# Patient Record
Sex: Male | Born: 1965 | Race: Black or African American | Hispanic: No | Marital: Single | Smoking: Never smoker
Health system: Southern US, Community
[De-identification: ages and names within clinical notes are randomized; demographics above are authoritative.]

## PROBLEM LIST (undated history)

## (undated) DIAGNOSIS — Z720 Tobacco use: Secondary | ICD-10-CM

## (undated) DIAGNOSIS — K760 Fatty (change of) liver, not elsewhere classified: Secondary | ICD-10-CM

## (undated) DIAGNOSIS — F1210 Cannabis abuse, uncomplicated: Secondary | ICD-10-CM

## (undated) DIAGNOSIS — R739 Hyperglycemia, unspecified: Secondary | ICD-10-CM

## (undated) DIAGNOSIS — D696 Thrombocytopenia, unspecified: Secondary | ICD-10-CM

## (undated) DIAGNOSIS — E663 Overweight: Secondary | ICD-10-CM

## (undated) DIAGNOSIS — M1990 Unspecified osteoarthritis, unspecified site: Secondary | ICD-10-CM

## (undated) HISTORY — PX: APPENDECTOMY: SHX54

---

## 1998-09-02 ENCOUNTER — Encounter: Payer: Self-pay | Admitting: Emergency Medicine

## 1998-09-02 ENCOUNTER — Emergency Department (HOSPITAL_COMMUNITY): Admission: EM | Admit: 1998-09-02 | Discharge: 1998-09-02 | Payer: Self-pay | Admitting: Emergency Medicine

## 2003-01-02 ENCOUNTER — Encounter: Payer: Self-pay | Admitting: Anesthesiology

## 2003-01-02 ENCOUNTER — Encounter (INDEPENDENT_AMBULATORY_CARE_PROVIDER_SITE_OTHER): Payer: Self-pay

## 2003-01-02 ENCOUNTER — Encounter: Payer: Self-pay | Admitting: Surgery

## 2003-01-03 ENCOUNTER — Inpatient Hospital Stay (HOSPITAL_COMMUNITY): Admission: EM | Admit: 2003-01-03 | Discharge: 2003-01-07 | Payer: Self-pay | Admitting: Emergency Medicine

## 2006-01-14 ENCOUNTER — Emergency Department (HOSPITAL_COMMUNITY): Admission: EM | Admit: 2006-01-14 | Discharge: 2006-01-14 | Payer: Self-pay | Admitting: Emergency Medicine

## 2006-02-20 ENCOUNTER — Emergency Department (HOSPITAL_COMMUNITY): Admission: EM | Admit: 2006-02-20 | Discharge: 2006-02-21 | Payer: Self-pay | Admitting: Emergency Medicine

## 2006-12-22 ENCOUNTER — Emergency Department (HOSPITAL_COMMUNITY): Admission: EM | Admit: 2006-12-22 | Discharge: 2006-12-22 | Payer: Self-pay | Admitting: Emergency Medicine

## 2007-04-15 ENCOUNTER — Ambulatory Visit (HOSPITAL_COMMUNITY): Admission: RE | Admit: 2007-04-15 | Discharge: 2007-04-15 | Payer: Self-pay | Admitting: Gastroenterology

## 2007-04-15 ENCOUNTER — Encounter (INDEPENDENT_AMBULATORY_CARE_PROVIDER_SITE_OTHER): Payer: Self-pay | Admitting: Gastroenterology

## 2010-09-23 ENCOUNTER — Emergency Department (HOSPITAL_COMMUNITY)
Admission: EM | Admit: 2010-09-23 | Discharge: 2010-09-24 | Disposition: A | Discharge status: Home / Self Care | Payer: Self-pay | Attending: Emergency Medicine | Admitting: Emergency Medicine

## 2010-09-23 DIAGNOSIS — R51 Headache: Secondary | ICD-10-CM | POA: Insufficient documentation

## 2010-09-23 DIAGNOSIS — I951 Orthostatic hypotension: Secondary | ICD-10-CM | POA: Insufficient documentation

## 2010-09-23 DIAGNOSIS — K921 Melena: Secondary | ICD-10-CM | POA: Insufficient documentation

## 2010-09-24 ENCOUNTER — Emergency Department (HOSPITAL_COMMUNITY): Payer: Self-pay

## 2010-09-24 LAB — CBC
HCT: 45 % (ref 39.0–52.0)
Hemoglobin: 15.5 g/dL (ref 13.0–17.0)
MCH: 29.8 pg (ref 26.0–34.0)
MCHC: 34.4 g/dL (ref 30.0–36.0)
MCV: 86.4 fL (ref 78.0–100.0)
Platelets: 143 10*3/uL — ABNORMAL LOW (ref 150–400)
RBC: 5.21 MIL/uL (ref 4.22–5.81)
RDW: 12.8 % (ref 11.5–15.5)
WBC: 7.2 10*3/uL (ref 4.0–10.5)

## 2010-09-24 LAB — DIFFERENTIAL
Basophils Absolute: 0 10*3/uL (ref 0.0–0.1)
Basophils Relative: 0 % (ref 0–1)
Eosinophils Absolute: 0.2 10*3/uL (ref 0.0–0.7)
Eosinophils Relative: 3 % (ref 0–5)
Lymphocytes Relative: 29 % (ref 12–46)
Lymphs Abs: 2.1 10*3/uL (ref 0.7–4.0)
Monocytes Absolute: 0.8 10*3/uL (ref 0.1–1.0)
Monocytes Relative: 12 % (ref 3–12)
Neutro Abs: 4.1 10*3/uL (ref 1.7–7.7)
Neutrophils Relative %: 56 % (ref 43–77)

## 2010-09-24 LAB — BASIC METABOLIC PANEL
Calcium: 8.8 mg/dL (ref 8.4–10.5)
Chloride: 107 mEq/L (ref 96–112)
Creatinine, Ser: 1.2 mg/dL (ref 0.4–1.5)
GFR calc Af Amer: >60 mL/min (ref >60)
GFR calc non Af Amer: >60 mL/min (ref >60)

## 2010-09-24 LAB — HEPATIC FUNCTION PANEL
Alkaline Phosphatase: 40 U/L (ref 39–117)
Bilirubin, Direct: 0.2 mg/dL (ref 0.0–0.3)
Indirect Bilirubin: 0.8 mg/dL (ref 0.3–0.9)
Total Bilirubin: 1 mg/dL (ref 0.3–1.2)
Total Protein: 7.6 g/dL (ref 6.0–8.3)

## 2010-09-24 LAB — OCCULT BLOOD, POC DEVICE: Fecal Occult Bld: NEGATIVE

## 2010-09-24 LAB — TROPONIN I: Troponin I: 0.01 ng/mL (ref 0.00–0.06)

## 2010-09-24 LAB — CK TOTAL AND CKMB (NOT AT ARMC): Total CK: 362 U/L — ABNORMAL HIGH (ref 7–232)

## 2010-11-08 NOTE — Op Note (Signed)
NAMEHAO, Ian Baker                         ACCOUNT NO.:  [id number]   MEDICAL RECORD NO.:  [id number]                   PATIENT TYPE:  EMS   LOCATION:  ED                                   FACILITY:  [facility]   PHYSICIAN:  Abigail Miyamoto, M.D.              DATE OF BIRTH:  Aug 19, 1965   DATE OF PROCEDURE:  01/02/2003  DATE OF DISCHARGE:                                 OPERATIVE REPORT   PREOPERATIVE DIAGNOSIS:  Perforated appendicitis.   POSTOPERATIVE DIAGNOSIS:  Perforated appendicitis.   PROCEDURE:  Open appendectomy.   SURGEON:  Abigail Miyamoto, M.D.   ANESTHESIA:  General endotracheal anesthesia.   ESTIMATED BLOOD LOSS:  Minimal.   INDICATIONS FOR PROCEDURE:  [name] is a 45 year old gentleman who  presented with right lower quadrant abdominal pain and tenderness. He also  had a fever of 102. A CT scan of the abdomen and pelvis was performed which  showed the patient to have appendicitis with an appendicolith and a possible  periappendiceal abscess.   FINDINGS:  The patient was indeed found to have perforated appendicitis with  abscess.   DESCRIPTION OF PROCEDURE:  The patient was brought to the operating room,  identified as Meredeth Ide. He was placed supine on the operating table  and general anesthesia was induced. His abdomen was then prepped and draped  in the usual sterile fashion. Using a #10 blade, a small transverse incision  was made in the patient's right lower quadrant. The incision was  carried  down through Scarpa's fascia with the electrocautery. The external oblique  fascia was then identified and opened with electrocautery and then further  with the Metzenbaum scissors. The underlying muscle fibers have been bluntly  dissected and retracted. The peritoneum was identified and opened with the  scalpel. Upon entering the abdomen, the patient was found to have turbid  fluid and a small abscess at the appendiceal base. The appendix was found  to  be stuck into the retroperitoneum but after finger fracturing was easily  elevated up out of the incision. the appendix was found to be ruptured.  Several adhesions around the appendix were taken down with the  electrocautery. The mesoappendix was then identified and clamped with a  hemostat and taken down with a 2-0 Vicryl tie. The base of the appendix was  then identified and clasped with a hemostat and then transected with a  scalpel. The appendiceal stump was then closed with two separate 2-0 Vicryl  suture ligatures. Hemostasis appeared to be achieved. The right lower  quadrant was then irrigated with copious amounts of normal saline. Again  hemostasis appeared to be achieved. The peritoneum was then closed with a  running 2-0 Vicryl suture. The fascia was then closed with a running #1  Prolene. The skin was then irrigated further. The Scarpa's fascia was  then closed interrupted 3-0 Vicryl sutures, the skin was closed with skin  staples.  The patient tolerated the procedure well. All sponge, needle and  instrument counts were correct at the end of the procedure. The patient was  then extubated in the operating room and taken in stable condition to the  recovery room.                                               Abigail Miyamoto, M.D.    DB/MEDQ  D:  01/02/2003  T:  01/02/2003  Job:  [id number]

## 2010-11-08 NOTE — Discharge Summary (Signed)
   NAMEFAARIS, ARIZPE                         ACCOUNT NO.:  [id number]   MEDICAL RECORD NO.:  [id number]                   PATIENT TYPE:  INP   LOCATION:  0462                                 FACILITY:  [facility]   PHYSICIAN:  Abigail Miyamoto, M.D.              DATE OF BIRTH:  1966-01-08   DATE OF ADMISSION:  01/02/2003  DATE OF DISCHARGE:  01/07/2003                                 DISCHARGE SUMMARY   SUMMARY OF HISTORY:  The patient is a 45 year old gentleman who presented to  the hospital with abdominal pain.  He was found to have a tender abdomen on  examination.  A CAT scan showed findings consistent with acute appendicitis.   HOSPITAL COURSE:  The patient was admitted on January 02, 2003 and taken to the  operating room where he underwent an open appendectomy for a perforated  appendix.  He was taken in stable condition to a regular surgical floor for  postoperative IV antibiotics.  He was maintained on IV antibiotics for  several days after surgery.  On postoperative day two his temperature was  101.  He did have hypokalemia and potassium was replaced orally.  IV  antibiotics were continued.  He continued to slowly spike temperatures, but  then eventually defervesced.  On postoperative day four he did have some  mild erythema at his incision.  This was unchanged from the previous days.  His abdomen remained nondistended.  He began having bowel movements and was  tolerating p.o. well.  On postoperative day five his white count was normal  at 7.1.  He was afebrile for greater than 24 hours.  His incision was  healing well and decision was made to discharge patient home.   DISCHARGE DIAGNOSES:  Perforated appendicitis status post open appendectomy.   DISCHARGE MEDICATIONS:  He will take Percocet and Advil for pain.  He will  take Augmentin 875 mg b.i.d. for seven days.   ACTIVITY:  He is to do no heavy lifting greater than 20 pounds for four  weeks.   WOUND CARE:  He may  shower.   DIET:  Unrestricted.   FOLLOWUP:  He will follow up with my office on this Wednesday after  discharge for wound check.                                               Abigail Miyamoto, M.D.    DB/MEDQ  D:  01/19/2003  T:  01/19/2003  Job:  [id number]

## 2010-11-08 NOTE — H&P (Signed)
NAMEHAGEN, Ian Baker                         ACCOUNT NO.:  [id number]   MEDICAL RECORD NO.:  [id number]                   PATIENT TYPE:  EMS   LOCATION:  ED                                   FACILITY:  [facility]   PHYSICIAN:  Abigail Miyamoto, M.D.              DATE OF BIRTH:  01-14-1966   DATE OF ADMISSION:  01/02/2003  DATE OF DISCHARGE:                                HISTORY & PHYSICAL   CHIEF COMPLAINT:  Abdominal pain.   HISTORY:  This is a 45 year old black gentleman who presents with a two to  three day history of right lower quadrant abdominal pain.  He reports he has  had nausea and vomiting with this, but he has been moving his bowels.  He  has otherwise been healthy.  Reports the pain is quite severe and doubling  him over.  He has also had fever with this, but no chills.  He denies any  jaundice.  Otherwise, from a cardiopulmonary standpoint he is without  complaints.   PAST MEDICAL HISTORY:  Negative.   PAST SURGICAL HISTORY:  Negative.   ALLERGIES:  No known drug allergies.   MEDICATIONS:  None.   SOCIAL HISTORY:  He does not smoke, does not drink alcohol.  He lives alone.   REVIEW OF SYSTEMS:  Negative from a cardiopulmonary standpoint.  Negative  from a urinary standpoint, and otherwise unremarkable or as above.   PHYSICAL EXAMINATION:  GENERAL:  A large black gentleman in moderate  discomfort laying on his side.  VITAL SIGNS:  Temperature is 102.5, pulse is 69, blood pressure is 112/75,  respiratory rate is 20.  GENERAL:  He is ill-appearing.  HEENT:  He is anicteric.  His pupils react bilaterally.  Ears, nose, mouth,  and throat shows external ears and nose are normal, hearing is normal,  oropharynx is clear.  NECK:  Supple.  There is no adenopathy.  There is no thyromegaly.  LUNGS:  Clear to auscultation bilaterally with normal effort.  CARDIOVASCULAR:  Regular rate and rhythm with no murmurs.  There is no  peripheral edema.  ABDOMEN:  Soft.  There is  marked tenderness with guarding in the right lower  quadrant.  The rest of the abdomen is soft and nontender throughout.  There  are no masses, there is no hernia, there is no organomegaly.  EXTREMITIES:  Warm and well perfused.   LABORATORY DATA:  The patient comes with labs from Urgent Care showing him  to have a normal urinalysis.  His white blood count is 10.3 with a  hemoglobin of 16.2, hematocrit of 45.5, and platelets of 141,000.  CAT scan  of the abdomen and pelvis shows the patient to have inflammation of the  right lower quadrant with appendicolith and possible abscess and inflamed-  appearing appendix.   ASSESSMENT AND PLAN:  This is a 45 year old gentleman with acute  appendicitis with possible appendiceal abscess with  perforation.  At this  point, the plan is to proceed to the operating room for appendectomy.  I  discussed  the risks of surgery with the patient, including the risk of bleeding,  infection, pelvic abscess, need to leave the skin open, bowel leak, etc.  At  this point, he wishes to proceed.  Surgery will be performed emergently.  Preoperative antibiotics and IV fluid bolus will be given.                                               Abigail Miyamoto, M.D.    DB/MEDQ  D:  01/02/2003  T:  01/02/2003  Job:  [id number]

## 2011-04-08 LAB — POCT CARDIAC MARKERS
CKMB, poc: 1.5
CKMB, poc: 1.5
Myoglobin, poc: 70.7
Operator id: 4761
Troponin i, poc: <0.05

## 2011-07-13 ENCOUNTER — Other Ambulatory Visit: Payer: Self-pay

## 2011-07-13 ENCOUNTER — Encounter (HOSPITAL_COMMUNITY): Payer: Self-pay | Admitting: *Deleted

## 2011-07-13 ENCOUNTER — Emergency Department (HOSPITAL_COMMUNITY): Payer: Self-pay

## 2011-07-13 ENCOUNTER — Inpatient Hospital Stay (HOSPITAL_COMMUNITY)
Admission: EM | Admit: 2011-07-13 | Discharge: 2011-07-15 | DRG: 287 | Disposition: A | Discharge status: Home / Self Care | Payer: 59 | Attending: Cardiology | Admitting: Cardiology

## 2011-07-13 DIAGNOSIS — K7689 Other specified diseases of liver: Secondary | ICD-10-CM | POA: Diagnosis present

## 2011-07-13 DIAGNOSIS — Z720 Tobacco use: Secondary | ICD-10-CM | POA: Insufficient documentation

## 2011-07-13 DIAGNOSIS — I214 Non-ST elevation (NSTEMI) myocardial infarction: Secondary | ICD-10-CM

## 2011-07-13 DIAGNOSIS — R079 Chest pain, unspecified: Secondary | ICD-10-CM | POA: Insufficient documentation

## 2011-07-13 DIAGNOSIS — R0789 Other chest pain: Principal | ICD-10-CM | POA: Diagnosis present

## 2011-07-13 DIAGNOSIS — D696 Thrombocytopenia, unspecified: Secondary | ICD-10-CM | POA: Insufficient documentation

## 2011-07-13 DIAGNOSIS — F17200 Nicotine dependence, unspecified, uncomplicated: Secondary | ICD-10-CM | POA: Diagnosis present

## 2011-07-13 DIAGNOSIS — R739 Hyperglycemia, unspecified: Secondary | ICD-10-CM | POA: Insufficient documentation

## 2011-07-13 DIAGNOSIS — M129 Arthropathy, unspecified: Secondary | ICD-10-CM | POA: Diagnosis present

## 2011-07-13 DIAGNOSIS — F1210 Cannabis abuse, uncomplicated: Secondary | ICD-10-CM | POA: Insufficient documentation

## 2011-07-13 DIAGNOSIS — E785 Hyperlipidemia, unspecified: Secondary | ICD-10-CM | POA: Diagnosis present

## 2011-07-13 DIAGNOSIS — R7309 Other abnormal glucose: Secondary | ICD-10-CM | POA: Diagnosis present

## 2011-07-13 HISTORY — DX: Hyperglycemia, unspecified: R73.9

## 2011-07-13 HISTORY — DX: Cannabis abuse, uncomplicated: F12.10

## 2011-07-13 HISTORY — DX: Overweight: E66.3

## 2011-07-13 HISTORY — DX: Fatty (change of) liver, not elsewhere classified: K76.0

## 2011-07-13 HISTORY — DX: Tobacco use: Z72.0

## 2011-07-13 HISTORY — DX: Unspecified osteoarthritis, unspecified site: M19.90

## 2011-07-13 HISTORY — DX: Thrombocytopenia, unspecified: D69.6

## 2011-07-13 LAB — DIFFERENTIAL
Basophils Relative: 0 % (ref 0–1)
Eosinophils Absolute: 0.1 10*3/uL (ref 0.0–0.7)
Eosinophils Relative: 1 % (ref 0–5)
Neutrophils Relative %: 42 % — ABNORMAL LOW (ref 43–77)

## 2011-07-13 LAB — RAPID URINE DRUG SCREEN, HOSP PERFORMED
Amphetamines: NOT DETECTED
Barbiturates: NOT DETECTED
Benzodiazepines: NOT DETECTED
Cocaine: NOT DETECTED
Tetrahydrocannabinol: POSITIVE — AB

## 2011-07-13 LAB — BASIC METABOLIC PANEL
BUN: 11 mg/dL (ref 6–23)
Calcium: 9.5 mg/dL (ref 8.4–10.5)
GFR calc Af Amer: >90 mL/min (ref >90)
GFR calc non Af Amer: 82 mL/min — ABNORMAL LOW (ref >90)
Potassium: 3.8 mEq/L (ref 3.5–5.1)
Sodium: 142 mEq/L (ref 135–145)

## 2011-07-13 LAB — CARDIAC PANEL(CRET KIN+CKTOT+MB+TROPI)
CK, MB: 2.1 ng/mL (ref 0.3–4.0)
Relative Index: 1 (ref 0.0–2.5)
Troponin I: <0.3 ng/mL (ref <0.30)

## 2011-07-13 LAB — URINALYSIS, ROUTINE W REFLEX MICROSCOPIC
Bilirubin Urine: NEGATIVE
Hgb urine dipstick: NEGATIVE
Ketones, ur: 15 mg/dL — AB
Nitrite: NEGATIVE
pH: 7.5 (ref 5.0–8.0)

## 2011-07-13 LAB — CBC
MCH: 29.6 pg (ref 26.0–34.0)
MCHC: 34.7 g/dL (ref 30.0–36.0)
MCV: 85.4 fL (ref 78.0–100.0)
Platelets: 152 10*3/uL (ref 150–400)

## 2011-07-13 LAB — D-DIMER, QUANTITATIVE: D-Dimer, Quant: 0.56 ug/mL-FEU — ABNORMAL HIGH (ref 0.00–0.48)

## 2011-07-13 LAB — TROPONIN I
Troponin I: <0.3 ng/mL (ref <0.30)
Troponin I: 0.49 ng/mL (ref <0.30)

## 2011-07-13 LAB — URINE MICROSCOPIC-ADD ON

## 2011-07-13 MED ORDER — IOHEXOL 300 MG/ML  SOLN
80.0000 mL | Freq: Once | INTRAMUSCULAR | Status: AC | PRN
  Administered 2011-07-13: 80 mL via INTRAVENOUS

## 2011-07-13 MED ORDER — SODIUM CHLORIDE 0.9 % IJ SOLN: 3.0000 mL | INTRAMUSCULAR | Status: DC | PRN

## 2011-07-13 MED ORDER — ACETAMINOPHEN 325 MG PO TABS
650.0000 mg | ORAL_TABLET | ORAL | Status: DC | PRN
  Administered 2011-07-13: 650 mg via ORAL
  Filled 2011-07-13: qty 2

## 2011-07-13 MED ORDER — HEPARIN BOLUS VIA INFUSION
4000.0000 [IU] | Freq: Once | INTRAVENOUS | Status: AC
  Administered 2011-07-13: 4000 [IU] via INTRAVENOUS
  Filled 2011-07-13: qty 4000

## 2011-07-13 MED ORDER — ROSUVASTATIN CALCIUM 40 MG PO TABS
40.0000 mg | ORAL_TABLET | Freq: Every day | ORAL | Status: DC
  Administered 2011-07-13 – 2011-07-14 (×2): 40 mg via ORAL
  Filled 2011-07-13 (×4): qty 1

## 2011-07-13 MED ORDER — ACETAMINOPHEN 325 MG PO TABS
650.0000 mg | ORAL_TABLET | Freq: Once | ORAL | Status: AC
  Administered 2011-07-13: 650 mg via ORAL
  Filled 2011-07-13: qty 2

## 2011-07-13 MED ORDER — SODIUM CHLORIDE 0.9 % IV SOLN: 250.0000 mL | INTRAVENOUS | Status: DC | PRN

## 2011-07-13 MED ORDER — ALPRAZOLAM 0.25 MG PO TABS: 0.2500 mg | ORAL_TABLET | Freq: Two times a day (BID) | ORAL | Status: DC | PRN

## 2011-07-13 MED ORDER — METOPROLOL TARTRATE 25 MG PO TABS
25.0000 mg | ORAL_TABLET | Freq: Two times a day (BID) | ORAL | Status: DC
  Administered 2011-07-13 – 2011-07-14 (×3): 25 mg via ORAL
  Filled 2011-07-13 (×6): qty 1

## 2011-07-13 MED ORDER — NITROGLYCERIN 0.4 MG SL SUBL
0.4000 mg | SUBLINGUAL_TABLET | SUBLINGUAL | Status: DC | PRN
  Administered 2011-07-13: 0.4 mg via SUBLINGUAL
  Filled 2011-07-13: qty 25

## 2011-07-13 MED ORDER — ASPIRIN EC 81 MG PO TBEC
81.0000 mg | DELAYED_RELEASE_TABLET | Freq: Every day | ORAL | Status: DC
  Filled 2011-07-13 (×3): qty 1

## 2011-07-13 MED ORDER — ASPIRIN 81 MG PO CHEW
324.0000 mg | CHEWABLE_TABLET | Freq: Once | ORAL | Status: AC
  Administered 2011-07-13: 324 mg via ORAL
  Filled 2011-07-13: qty 4

## 2011-07-13 MED ORDER — ZOLPIDEM TARTRATE 5 MG PO TABS: 10.0000 mg | ORAL_TABLET | Freq: Every evening | ORAL | Status: DC | PRN

## 2011-07-13 MED ORDER — SODIUM CHLORIDE 0.9 % IJ SOLN: 3.0000 mL | Freq: Two times a day (BID) | INTRAMUSCULAR | Status: DC

## 2011-07-13 MED ORDER — SODIUM CHLORIDE 0.9 % IJ SOLN
3.0000 mL | Freq: Two times a day (BID) | INTRAMUSCULAR | Status: DC
  Administered 2011-07-13 – 2011-07-14 (×2): 3 mL via INTRAVENOUS

## 2011-07-13 MED ORDER — SODIUM CHLORIDE 0.9 % IV SOLN
1.0000 mL/kg/h | INTRAVENOUS | Status: DC
  Administered 2011-07-14: 1.058 mL/kg/h via INTRAVENOUS
  Administered 2011-07-14: 1 mL/kg/h via INTRAVENOUS

## 2011-07-13 MED ORDER — HEPARIN BOLUS VIA INFUSION
3000.0000 [IU] | Freq: Once | INTRAVENOUS | Status: AC
  Administered 2011-07-13: 3000 [IU] via INTRAVENOUS
  Filled 2011-07-13: qty 3000

## 2011-07-13 MED ORDER — ONDANSETRON HCL 4 MG/2ML IJ SOLN: 4.0000 mg | Freq: Four times a day (QID) | INTRAMUSCULAR | Status: DC | PRN

## 2011-07-13 MED ORDER — HEPARIN SOD (PORCINE) IN D5W 100 UNIT/ML IV SOLN
1500.0000 [IU]/h | INTRAVENOUS | Status: DC
  Administered 2011-07-13: 1200 [IU]/h via INTRAVENOUS
  Administered 2011-07-13 – 2011-07-14 (×2): 1500 [IU]/h via INTRAVENOUS
  Filled 2011-07-13 (×4): qty 250

## 2011-07-13 NOTE — ED Notes (Signed)
Pt complians of chest pain on set at 0730 while getting out of car. Pt sts that worse with deep breath and feels sob, pt denies nausea, denies worse with movement or palpation, nad noted, resp e/u

## 2011-07-13 NOTE — H&P (Signed)
Patient ID: Ian Baker MRN: [id number], DOB/AGE: May 21, 1966   Admit date: 07/13/2011   Primary Physician: None Primary Cardiologist: New to [place]  Pt. Profile:   46 y/o male w/o prior cardiac history that presented to ED this am 2/2 chest pain and tachypalps.  Problem List: Past Medical History  Diagnosis Date  . Arthritis     bilat knees  . Overweight   . Tobacco abuse     1 thin cigar every few months  . Marijuana abuse     smokes about 1x/month    Past Surgical History  Procedure Date  . Appendectomy     20005     Allergies: No Known Allergies  HPI:   46 y/o male w/o prior cardiac history.  This am, after awakening, he ate a brownie that was laced with Marijuana.  About an hour later, he noted tachypalps with assoc chest burning and tightness and dyspnea.  No nausea/diaphoresis.  He went back inside his house and sat and when Ss did not improve, he called a cab to take him to the ED.  Upon arrival to the ED, pt cont to c/o c/p and palps - his HR was in the 90's - sinus rhythm.  He was treated with asa.  CT of his chest was done and was negative.  Though his initial troponin was wnl, f/u troponin is elevated @ 0.49.  We've been asked to eval.  Duration of Ss was about 4 hrs.  He is currently pain free but says he feels "out of sorts," which he attributes to the marijuana.   Home Medications Medications Prior to Admission  Medication Dose Route Frequency Provider Last Rate Last Dose  . acetaminophen (TYLENOL) tablet 650 mg  650 mg Oral Once Glynn Octave, MD   650 mg at 07/13/11 1332  . aspirin chewable tablet 324 mg  324 mg Oral Once Glynn Octave, MD   324 mg at 07/13/11 0953  . heparin ADULT infusion 100 units/ml (25000 units/250 ml)  1,200 Units/hr Intravenous Continuous Marylouise Stacks, PHARMD      . heparin bolus via infusion 4,000 Units  4,000 Units Intravenous Once Marylouise Stacks, PHARMD      . iohexol (OMNIPAQUE) 300 MG/ML solution 80 mL  80  mL Intravenous Once PRN Medication Radiologist, MD   80 mL at 07/13/11 1118   Home Meds  Prn advil/goody's powder  Family History  Problem Relation Age of Onset  . Diabetes Mother   . Coronary artery disease Mother     onset 64's, currently 48    History   Social History  . Marital Status: Single    Spouse Name: N/A    Number of Children: N/A  . Years of Education: N/A   Occupational History  . Cab Driver    Social History Main Topics  . Smoking status: Current Everyday Smoker    Types: Cigars  . Smokeless tobacco: Not on file   Comment: smokes 1 cigar every 3-4 months.  . Alcohol Use: Yes     has a few drinks/month  . Drug Use: Yes     uses marijuana about 1x/month  . Sexually Active: Yes   Other Topics Concern  . Not on file   Social History Narrative   Lives by himself in [place].  Works as a Scientist, physiological.  Walks regularly.  Frequently does push ups.     Review of Systems: General: negative for chills, fever, night sweats or weight changes.  Cardiovascular: +++ for chest pain, dyspnea, and palpitations.  No edema, orthopnea, paroxysmal nocturnal dyspnea. Dermatological: negative for rash Respiratory: negative for cough or wheezing Urologic: negative for hematuria Abdominal: negative for nausea, vomiting, diarrhea, bright red blood per rectum, melena, or hematemesis Neurologic: negative for visual changes, syncope, or dizziness All other systems reviewed and are otherwise negative except as noted above.  Physical Exam: Blood pressure 151/87, pulse 98, temperature 98.4 F (36.9 C), temperature source Oral, resp. rate 18, height 6' (1.829 m), weight 250 lb (113.399 kg).  General: Well developed, well nourished, in no acute distress. Head: Normocephalic, atraumatic, sclera non-icteric, no xanthomas, nares are without discharge.  Neck: Supple without bruits or JVD. Lungs:  Resp regular and unlabored, CTA. Heart: RRR, split s2.  no s3, s4.  1/6 syst murmur @ upper  sternal border. Abdomen: Soft, non-tender, non-distended, BS + x 4.  Msk:  Strength and tone appears normal for age. Extremities: No clubbing, cyanosis or edema. DP/PT/Radials 2+ and equal bilaterally. Neuro: Alert and oriented X 3. Moves all extremities spontaneously. Psych: Normal affect.   Labs:   Results for orders placed during the hospital encounter of 07/13/11 (from the past 72 hour(s))  CBC     Status: Normal   Collection Time   07/13/11  9:40 AM      Component Value Range Comment   WBC 6.4  4.0 - 10.5 (K/uL)    RBC 5.27  4.22 - 5.81 (MIL/uL)    Hemoglobin 15.6  13.0 - 17.0 (g/dL)    HCT 16.1  09.6 - 04.5 (%)    MCV 85.4  78.0 - 100.0 (fL)    MCH 29.6  26.0 - 34.0 (pg)    MCHC 34.7  30.0 - 36.0 (g/dL)    RDW 40.9  81.1 - 91.4 (%)    Platelets 152  150 - 400 (K/uL)   DIFFERENTIAL     Status: Abnormal   Collection Time   07/13/11  9:40 AM      Component Value Range Comment   Neutrophils Relative 42 (*) 43 - 77 (%)    Neutro Abs 2.7  1.7 - 7.7 (K/uL)    Lymphocytes Relative 49 (*) 12 - 46 (%)    Lymphs Abs 3.1  0.7 - 4.0 (K/uL)    Monocytes Relative 8  3 - 12 (%)    Monocytes Absolute 0.5  0.1 - 1.0 (K/uL)    Eosinophils Relative 1  0 - 5 (%)    Eosinophils Absolute 0.1  0.0 - 0.7 (K/uL)    Basophils Relative 0  0 - 1 (%)    Basophils Absolute 0.0  0.0 - 0.1 (K/uL)   BASIC METABOLIC PANEL     Status: Abnormal   Collection Time   07/13/11  9:40 AM      Component Value Range Comment   Sodium 142  135 - 145 (mEq/L)    Potassium 3.8  3.5 - 5.1 (mEq/L)    Chloride 104  96 - 112 (mEq/L)    CO2 28  19 - 32 (mEq/L)    Glucose, Bld 122 (*) 70 - 99 (mg/dL)    BUN 11  6 - 23 (mg/dL)    Creatinine, Ser 7.82  0.50 - 1.35 (mg/dL)    Calcium 9.5  8.4 - 10.5 (mg/dL)    GFR calc non Af Amer 82 (*) >90 (mL/min)    GFR calc Af Amer >90  >90 (mL/min)   PROTIME-INR     Status:  Normal   Collection Time   07/13/11  9:40 AM      Component Value Range Comment   Prothrombin Time 12.5   11.6 - 15.2 (seconds)    INR 0.91  0.00 - 1.49    TROPONIN I     Status: Normal   Collection Time   07/13/11  9:40 AM      Component Value Range Comment   Troponin I <0.30  <0.30 (ng/mL)   D-DIMER, QUANTITATIVE     Status: Abnormal   Collection Time   07/13/11  9:40 AM      Component Value Range Comment   D-Dimer, Quant 0.56 (*) 0.00 - 0.48 (ug/mL-FEU)   ETHANOL     Status: Normal   Collection Time   07/13/11 10:18 AM      Component Value Range Comment   Alcohol, Ethyl (B) <11  0 - 11 (mg/dL)   URINALYSIS, ROUTINE W REFLEX MICROSCOPIC     Status: Abnormal   Collection Time   07/13/11 10:43 AM      Component Value Range Comment   Color, Urine YELLOW  YELLOW     APPearance CLEAR  CLEAR     Specific Gravity, Urine 1.029  1.005 - 1.030     pH 7.5  5.0 - 8.0     Glucose, UA NEGATIVE  NEGATIVE (mg/dL)    Hgb urine dipstick NEGATIVE  NEGATIVE     Bilirubin Urine NEGATIVE  NEGATIVE     Ketones, ur 15 (*) NEGATIVE (mg/dL)    Protein, ur 30 (*) NEGATIVE (mg/dL)    Urobilinogen, UA 0.2  0.0 - 1.0 (mg/dL)    Nitrite NEGATIVE  NEGATIVE     Leukocytes, UA NEGATIVE  NEGATIVE    URINE RAPID DRUG SCREEN (HOSP PERFORMED)     Status: Abnormal   Collection Time   07/13/11 10:43 AM      Component Value Range Comment   Opiates NONE DETECTED  NONE DETECTED     Cocaine NONE DETECTED  NONE DETECTED     Benzodiazepines NONE DETECTED  NONE DETECTED     Amphetamines NONE DETECTED  NONE DETECTED     Tetrahydrocannabinol POSITIVE (*) NONE DETECTED     Barbiturates NONE DETECTED  NONE DETECTED    URINE MICROSCOPIC-ADD ON     Status: Normal   Collection Time   07/13/11 10:43 AM      Component Value Range Comment   Squamous Epithelial / LPF RARE  RARE    TROPONIN I     Status: Abnormal   Collection Time   07/13/11 12:25 PM      Component Value Range Comment   Troponin I 0.49 (*) <0.30 (ng/mL)      Radiology/Studies: Dg Chest 2 View  07/13/2011  *RADIOLOGY REPORT*  Clinical Data: Chest pain  CHEST - 2  VIEW  Comparison:  None.  Findings:  The heart size and mediastinal contours are within normal limits.  Both lungs are clear.  The visualized skeletal structures are unremarkable.  IMPRESSION: No active cardiopulmonary disease.  Original Report Authenticated By: Harrel Lemon, M.D.   Ct Angio Chest W/cm &/or Wo Cm  07/13/2011  *RADIOLOGY REPORT*  Clinical Data: Chest pain, altered mental status  CT ANGIOGRAPHY CHEST  Technique:  Multidetector CT imaging of the chest using the standard protocol during bolus administration of intravenous contrast. Multiplanar reconstructed images including MIPs were obtained and reviewed to evaluate the vascular anatomy.  Contrast: 80mL OMNIPAQUE IOHEXOL 300 MG/ML IV  SOLN  Comparison: Chest radiographs dated 07/13/2011  Findings: No evidence of pulmonary embolism.  Mild dependent atelectasis in the bilateral lower lobes.  The lungs are otherwise clear.  No pleural effusion or pneumothorax.  The heart is normal in size.  No pericardial effusion.  No suspicious mediastinal, hilar, or axillary lymphadenopathy.  Visualized upper abdomen is notable for hepatic steatosis.  Visualized osseous structures are within normal limits.  IMPRESSION: No evidence of pulmonary embolism.  No evidence of acute cardiopulmonary disease.  Original Report Authenticated By: Charline Bills, M.D.    EKG:  RSR, 90, no acute st/t changes.  ASSESSMENT AND PLAN:   1.  NSTEMI:  Pt presented with c/p this am and has now been found to have an elevated troponin.  He is currently pain free.  Admit and cycle CE.  Temporal relationship between marijuana usage and Ss.  UDS negative for cocaine.  Add asa, bb, statin, heparin.  Will plan cath in am or sooner for recurrent/recalcitrant pain.  2.  HTN:  Add bb in setting of above.  Follow.  3.  Lipids:  Status unknown.  Check lipids/lft's.  Add high dose statin.  4.  MJ/Tob Abuse:  Cessation advised.  Will need formal counseling.   Signed, Nicolasa Ducking, NP 07/13/2011, 3:21 PM  History reviewed with the patient, no changes to be made.  Chest pain consistent with unstable angina after eating marijuana laced brownie. Troponin elevated.  EKG without acute changes.  Other drug screen negative. The patient exam reveals no distress, lungs clear, abdomin obese, no edema, normal pulses.  All available labs, radiology testing, previous records reviewed. Agree with documented assessment and plan. Given the description of the pain and the elevated troponin catheterization is indicated.   Fayrene Fearing Chibueze Beasley  3:55 PM 05/09/2011

## 2011-07-13 NOTE — ED Notes (Signed)
Pt reports approx 1.5hrs ago he developed left sided non radiating chest pain, grabs entire left chest wall, states he was driving when chest pain started. Reports sob. Denies nausea or diaphoresis. Denies medical hx.

## 2011-07-13 NOTE — ED Notes (Signed)
Gave pt a urine cup and a urineal and let him know I needed urine. 10:38 am Ian Baker

## 2011-07-13 NOTE — ED Notes (Signed)
Patient complains of not feeling well. Pt related to eating "weed brownies" at 630 this morning and sts he may have "over done it"

## 2011-07-13 NOTE — ED Notes (Signed)
Critical troponin 0.49 reported to Dr. Manus Gunning

## 2011-07-13 NOTE — ED Provider Notes (Signed)
History     CSN: [id number]  Arrival date & time 07/13/11  4098   First MD Initiated Contact with Patient 07/13/11 [id number]      Chief Complaint  Patient presents with  . Chest Pain    (Consider location/radiation/quality/duration/timing/severity/associated sxs/prior treatment) HPI Comments: Patient presenting with substernal and left-sided chest pain onset about an hour ago. The pain has been constant for the past hour and is not associated with any shortness of breath, nausea diaphoresis. He mentions about 2 hours ago he eats some pot laced brownies.  He denies any cardiac history he denies any cocaine abuse. He's never had panic this in the past. He denies any other drug use. He has no abdominal pain, nausea or vomiting. He's never had a stress test. He is not a smoker.  The history is provided by the patient.    Past Medical History  Diagnosis Date  . Arthritis     bilat knees  . Overweight   . Tobacco abuse     1 thin cigar every few months  . Marijuana abuse     smokes about 1x/month    Past Surgical History  Procedure Date  . Appendectomy     20005    Family History  Problem Relation Age of Onset  . Diabetes Mother   . Coronary artery disease Mother     onset 21's, currently 90    History  Substance Use Topics  . Smoking status: Current Everyday Smoker    Types: Cigars  . Smokeless tobacco: Not on file   Comment: smokes 1 cigar every 3-4 months.  . Alcohol Use: Yes     has a few drinks/month      Review of Systems  Constitutional: Negative for fever, activity change and appetite change.  HENT: Negative for congestion and rhinorrhea.   Respiratory: Negative for cough and shortness of breath.   Cardiovascular: Positive for chest pain.  Gastrointestinal: Negative for nausea, vomiting and abdominal pain.  Genitourinary: Negative for dysuria and hematuria.  Musculoskeletal: Negative for back pain.  Skin: Negative for rash.  Neurological: Negative for  headaches.    Allergies  Review of patient's allergies indicates no known allergies.  Home Medications   No current outpatient prescriptions on file.  BP 107/63  Pulse 88  Temp(Src) 98.1 F (36.7 C) (Oral)  Resp 15  Ht 6' (1.829 m)  Wt 250 lb (113.399 kg)  BMI 33.91 kg/m2  SpO2 98%  Physical Exam  Constitutional: He is oriented to person, place, and time. He appears well-developed and well-nourished. No distress.  HENT:  Head: Normocephalic and atraumatic.  Mouth/Throat: Oropharynx is clear and moist. No oropharyngeal exudate.  Eyes: Conjunctivae are normal. Pupils are equal, round, and reactive to light.  Neck: Normal range of motion.  Cardiovascular: Normal rate, regular rhythm and normal heart sounds.   Pulmonary/Chest: Effort normal and breath sounds normal. No respiratory distress. He exhibits no tenderness.       Chest pain is not reproducible. There is no rash the chest wall.  Abdominal: Soft. There is no tenderness. There is no rebound and no guarding.  Musculoskeletal: Normal range of motion. He exhibits no edema and no tenderness.  Neurological: He is alert and oriented to person, place, and time. No cranial nerve deficit.  Skin: Skin is warm.    ED Course  Procedures (including critical care time)  Labs Reviewed  DIFFERENTIAL - Abnormal; Notable for the following:    Neutrophils Relative 42 (*)  Lymphocytes Relative 49 (*)    All other components within normal limits  BASIC METABOLIC PANEL - Abnormal; Notable for the following:    Glucose, Bld 122 (*)    GFR calc non Af Amer 82 (*)    All other components within normal limits  URINALYSIS, ROUTINE W REFLEX MICROSCOPIC - Abnormal; Notable for the following:    Ketones, ur 15 (*)    Protein, ur 30 (*)    All other components within normal limits  URINE RAPID DRUG SCREEN (HOSP PERFORMED) - Abnormal; Notable for the following:    Tetrahydrocannabinol POSITIVE (*)    All other components within normal  limits  D-DIMER, QUANTITATIVE - Abnormal; Notable for the following:    D-Dimer, Quant 0.56 (*)    All other components within normal limits  TROPONIN I - Abnormal; Notable for the following:    Troponin I 0.49 (*)    All other components within normal limits  CBC  PROTIME-INR  TROPONIN I  ETHANOL  URINE MICROSCOPIC-ADD ON  HEPARIN LEVEL (UNFRACTIONATED)  MRSA PCR SCREENING   Dg Chest 2 View  07/13/2011  *RADIOLOGY REPORT*  Clinical Data: Chest pain  CHEST - 2 VIEW  Comparison:  None.  Findings:  The heart size and mediastinal contours are within normal limits.  Both lungs are clear.  The visualized skeletal structures are unremarkable.  IMPRESSION: No active cardiopulmonary disease.  Original Report Authenticated By: Harrel Lemon, M.D.   Ct Angio Chest W/cm &/or Wo Cm  07/13/2011  *RADIOLOGY REPORT*  Clinical Data: Chest pain, altered mental status  CT ANGIOGRAPHY CHEST  Technique:  Multidetector CT imaging of the chest using the standard protocol during bolus administration of intravenous contrast. Multiplanar reconstructed images including MIPs were obtained and reviewed to evaluate the vascular anatomy.  Contrast: 80mL OMNIPAQUE IOHEXOL 300 MG/ML IV SOLN  Comparison: Chest radiographs dated 07/13/2011  Findings: No evidence of pulmonary embolism.  Mild dependent atelectasis in the bilateral lower lobes.  The lungs are otherwise clear.  No pleural effusion or pneumothorax.  The heart is normal in size.  No pericardial effusion.  No suspicious mediastinal, hilar, or axillary lymphadenopathy.  Visualized upper abdomen is notable for hepatic steatosis.  Visualized osseous structures are within normal limits.  IMPRESSION: No evidence of pulmonary embolism.  No evidence of acute cardiopulmonary disease.  Original Report Authenticated By: Charline Bills, M.D.     1. NSTEMI (non-ST elevated myocardial infarction)       MDM  Chest pain after being marijuana laced brownies.  Hemodynamically stable, EKG nonischemic. Sending labs with troponin, d-dimer and drug screen  First set of cardiac enzymes negative. D-dimer mildly elevated. Patient's pain is improving. He says he feels "loopy" which he attribute to marijuana use.    CT negative for pulmonary embolism. No appreciable rash and patient's chest wall. Second cardiac enzyme elevated at 0.49. Suspect mild demand ischemia from marijuana ingestion. Discussed with Dr. Dietrich Pates who will consult on patient. Treat as NSTEMI and initiate heparin and nitroglycerin.   Date: 07/13/2011  Rate: 90  Rhythm: normal sinus rhythm  QRS Axis: normal  Intervals: normal  ST/T Wave abnormalities: normal  Conduction Disutrbances:none  Narrative Interpretation:   Old EKG Reviewed: unchanged  CRITICAL CARE Performed by: Glynn Octave   Total critical care time: 30  Critical care time was exclusive of separately billable procedures and treating other patients.  Critical care was necessary to treat or prevent imminent or life-threatening deterioration.  Critical care was time spent  personally by me on the following activities: development of treatment plan with patient and/or surrogate as well as nursing, discussions with consultants, evaluation of patient's response to treatment, examination of patient, obtaining history from patient or surrogate, ordering and performing treatments and interventions, ordering and review of laboratory studies, ordering and review of radiographic studies, pulse oximetry and re-evaluation of patient's condition.         Glynn Octave, MD 07/13/11 [phone]

## 2011-07-13 NOTE — Progress Notes (Signed)
ANTICOAGULATION CONSULT NOTE - Initial Consult  Pharmacy Consult for heparin Indication: chest pain/ACS  Not on File  Patient Measurements: Height: 6' (182.9 cm) Weight: 250 lb (113.399 kg) IBW/kg (Calculated) : 77.6   Vital Signs: Temp: 98.4 F (36.9 C) (01/20 0931) Temp src: Oral (01/20 0931) BP: 151/87 mmHg (01/20 0931) Pulse Rate: 98  (01/20 0931)  Labs:  Basename 07/13/11 1225 07/13/11 0940  HGB -- 15.6  HCT -- 45.0  PLT -- 152  APTT -- --  LABPROT -- 12.5  INR -- 0.91  HEPARINUNFRC -- --  CREATININE -- 1.07  CKTOTAL -- --  CKMB -- --  TROPONINI 0.49* <0.30   Estimated Creatinine Clearance: 112.1 ml/min (by C-G formula based on Cr of 1.07).  Medical History: History reviewed. No pertinent past medical history.  Medications:  Scheduled:    . acetaminophen  650 mg Oral Once  . aspirin  324 mg Oral Once    Assessment: 46 yr old male on heparin for ACS/STEMI  Goal of Therapy:  Heparin level=0.3-0.7   Plan:  Heparin 4000 units IV bolus, then run heparin drip at 1200 units/hr.   Check heparin level 6 hours after drip start. Daily heparin level/CBC  Wendie Simmer, PharmD, BCPS Clinical Pharmacist   07/13/2011,2:08 PM

## 2011-07-13 NOTE — ED Notes (Signed)
Pharmacy has been contacted multiple times reagarding heparin order, no heparin on unit, awaiting pharmacy to send heparin and then will hang it.

## 2011-07-13 NOTE — Progress Notes (Signed)
ANTICOAGULATION CONSULT NOTE - Follow Up Consult  Pharmacy Consult for Heparin Indication: chest pain/ACS  No Known Allergies  Patient Measurements: Height: 6' (182.9 cm) Weight: 260 lb 9.3 oz (118.2 kg) IBW/kg (Calculated) : 77.6  Heparin dosing weight: 103.4 kg  Vital Signs: Temp: 98.9 F (37.2 C) (01/20 1947) Temp src: Oral (01/20 1715) BP: 113/73 mmHg (01/20 2200) Pulse Rate: 66  (01/20 2200)  Labs:  Basename 07/13/11 2100 07/13/11 1727 07/13/11 1225 07/13/11 0940  HGB -- -- -- 15.6  HCT -- -- -- 45.0  PLT -- -- -- 152  APTT -- -- -- --  LABPROT -- -- -- 12.5  INR -- -- -- 0.91  HEPARINUNFRC 0.24* -- -- --  CREATININE -- -- -- 1.07  CKTOTAL -- 221 -- --  CKMB -- 2.1 -- --  TROPONINI -- <0.30 0.49* <0.30   Estimated Creatinine Clearance: 114.4 ml/min (by C-G formula based on Cr of 1.07).   Medications:  Infusions:    . sodium chloride    . heparin 1,200 Units/hr (07/13/11 1527)    Assessment: 46 year old male on heparin for ACS/STEMI. Heparin level is sub-therapeutic on 1200 units/hr (12 ml/hr). Plans for cath in am (may go sooner if recurrent pain). RN reports no issues with heparin.   Goal of Therapy:  Heparin level 0.3-0.7 units/ml   Plan:  1. Heparin bolus of 3000 units, then increase heparin to 1500 units/hr (15 ml/hr). 2. Heparin level in 6 hours (ok to do with am labs).   Fayne Norrie 07/13/2011,10:15 PM

## 2011-07-13 NOTE — ED Notes (Signed)
Medications verified heparin dosage with Apolonio Schneiders roby rn

## 2011-07-14 ENCOUNTER — Other Ambulatory Visit: Payer: Self-pay

## 2011-07-14 ENCOUNTER — Encounter (HOSPITAL_COMMUNITY): Payer: Self-pay | Admitting: Dietician

## 2011-07-14 ENCOUNTER — Encounter (HOSPITAL_COMMUNITY)
Admission: EM | Disposition: A | Discharge status: Home / Self Care | Payer: Self-pay | Source: Home / Self Care | Attending: Cardiology

## 2011-07-14 DIAGNOSIS — I219 Acute myocardial infarction, unspecified: Secondary | ICD-10-CM

## 2011-07-14 DIAGNOSIS — R079 Chest pain, unspecified: Secondary | ICD-10-CM

## 2011-07-14 HISTORY — PX: LEFT HEART CATHETERIZATION WITH CORONARY ANGIOGRAM: SHX5451

## 2011-07-14 LAB — BASIC METABOLIC PANEL
BUN: 8 mg/dL (ref 6–23)
Calcium: 9.1 mg/dL (ref 8.4–10.5)
Creatinine, Ser: 1.11 mg/dL (ref 0.50–1.35)
GFR calc non Af Amer: 78 mL/min — ABNORMAL LOW (ref >90)
Glucose, Bld: 106 mg/dL — ABNORMAL HIGH (ref 70–99)

## 2011-07-14 LAB — CBC
HCT: 44.1 % (ref 39.0–52.0)
Hemoglobin: 15.4 g/dL (ref 13.0–17.0)
MCV: 85.3 fL (ref 78.0–100.0)
WBC: 7 10*3/uL (ref 4.0–10.5)

## 2011-07-14 LAB — HEPATIC FUNCTION PANEL
ALT: 39 U/L (ref 0–53)
AST: 27 U/L (ref 0–37)
Albumin: 3.8 g/dL (ref 3.5–5.2)
Bilirubin, Direct: <0.1 mg/dL (ref 0.0–0.3)
Total Bilirubin: 0.5 mg/dL (ref 0.3–1.2)

## 2011-07-14 LAB — HEPARIN LEVEL (UNFRACTIONATED): Heparin Unfractionated: 0.5 IU/mL (ref 0.30–0.70)

## 2011-07-14 LAB — LIPID PANEL
Cholesterol: 172 mg/dL (ref 0–200)
LDL Cholesterol: 102 mg/dL — ABNORMAL HIGH (ref 0–99)
Triglycerides: 109 mg/dL (ref <150)

## 2011-07-14 LAB — CARDIAC PANEL(CRET KIN+CKTOT+MB+TROPI)
CK, MB: 2.1 ng/mL (ref 0.3–4.0)
Total CK: 187 U/L (ref 7–232)

## 2011-07-14 SURGERY — LEFT HEART CATHETERIZATION WITH CORONARY ANGIOGRAM
Anesthesia: LOCAL

## 2011-07-14 MED ORDER — NITROGLYCERIN 0.2 MG/ML ON CALL CATH LAB
INTRAVENOUS | Status: AC
  Filled 2011-07-14: qty 1

## 2011-07-14 MED ORDER — INFLUENZA VIRUS VACC SPLIT PF IM SUSP
0.5000 mL | INTRAMUSCULAR | Status: AC
  Administered 2011-07-15: 0.5 mL via INTRAMUSCULAR
  Filled 2011-07-14: qty 0.5

## 2011-07-14 MED ORDER — HEART ATTACK BOUNCING BOOK
Freq: Once | Status: AC
  Administered 2011-07-14: 23:00:00
  Filled 2011-07-14: qty 1

## 2011-07-14 MED ORDER — HEPARIN (PORCINE) IN NACL 2-0.9 UNIT/ML-% IJ SOLN
INTRAMUSCULAR | Status: AC
  Filled 2011-07-14: qty 2000

## 2011-07-14 MED ORDER — HEPARIN SODIUM (PORCINE) 1000 UNIT/ML IJ SOLN
INTRAMUSCULAR | Status: AC
  Filled 2011-07-14: qty 1

## 2011-07-14 MED ORDER — VERAPAMIL HCL 2.5 MG/ML IV SOLN
INTRAVENOUS | Status: AC
  Filled 2011-07-14: qty 2

## 2011-07-14 MED ORDER — SODIUM CHLORIDE 0.9 % IV SOLN
INTRAVENOUS | Status: AC
  Administered 2011-07-14: 18:00:00 via INTRAVENOUS

## 2011-07-14 MED ORDER — ASPIRIN 81 MG PO CHEW
CHEWABLE_TABLET | ORAL | Status: AC
  Administered 2011-07-14: 324 mg
  Filled 2011-07-14: qty 4

## 2011-07-14 MED ORDER — FENTANYL CITRATE 0.05 MG/ML IJ SOLN
INTRAMUSCULAR | Status: AC
  Filled 2011-07-14: qty 2

## 2011-07-14 MED ORDER — LIDOCAINE HCL (PF) 1 % IJ SOLN
INTRAMUSCULAR | Status: AC
  Filled 2011-07-14: qty 30

## 2011-07-14 MED ORDER — MIDAZOLAM HCL 2 MG/2ML IJ SOLN
INTRAMUSCULAR | Status: AC
  Filled 2011-07-14: qty 2

## 2011-07-14 NOTE — Progress Notes (Signed)
 Patient Name: Ian Baker Date of Encounter: 07/14/2011  Active Problems:  * No active hospital problems. *     SUBJECTIVE:STEMI   OBJECTIVE  Filed Vitals:   07/14/11 0400 07/14/11 0505 07/14/11 0600 07/14/11 0700  BP: 100/56 118/63 119/67   Pulse: 58 61 61   Temp: 97.9 F (36.6 C)     TempSrc: Oral     Resp: 0     Height:      Weight:    264 lb 8.8 oz (120 kg)  SpO2: 98% 100% 100%     Intake/Output Summary (Last 24 hours) at 07/14/11 0758 Last data filed at 07/14/11 0700  Gross per 24 hour  Intake  782.8 ml  Output    650 ml  Net  132.8 ml   Weight change:   PHYSICAL EXAM  General: Well developed, well nourished, in no acute distress. Head: Normocephalic, atraumatic.  Neck: Supple without bruits, no elevation in JVD. Lungs:  Resp regular and unlabored, CTA bilaterally. Heart: RRR, normal S1/S2 no s3, s4, or murmurs. Abdomen: Soft, non-tender, non-distended, BS + x 4.  Extremities: No clubbing, cyanosis, no edema.  Neuro: Alert and oriented X 3. Moves all extremities spontaneously. Psych: Normal affect.  LABS:  CBC: Basename 07/14/11 0503 07/13/11 0940  WBC 7.0 6.4  NEUTROABS -- 2.7  HGB 15.4 15.6  HCT 44.1 45.0  MCV 85.3 85.4  PLT 143* 152   Basic Metabolic Panel: Basename 07/14/11 0503 07/13/11 0940  NA 141 142  K 3.9 3.8  CL 105 104  CO2 29 28  GLUCOSE 106* 122*  BUN 8 11  CREATININE 1.11 1.07  CALCIUM 9.1 9.5  MG -- --  PHOS -- --   Liver Function Tests: No results found for this basename: AST:2,ALT:2,ALKPHOS:2,BILITOT:2,PROT:2,ALBUMIN:2 in the last 72 hours No results found for this basename: LIPASE:2,AMYLASE:2 in the last 72 hours  Cardiac Enzymes: Basename 07/14/11 0025 07/13/11 1727 07/13/11 1225  CKTOTAL 187 221 --  CKMB 2.1 2.1 --  CKMBINDEX -- -- --  TROPONINI <0.30 <0.30 0.49*   BNP: No components found with this basename: POCBNP:3  D-Dimer: Basename 07/13/11 0940  DDIMER 0.56*   Hemoglobin A1C:  Basename  07/13/11 2100  HGBA1C 5.8*   Fasting Lipid Panel:  Basename 07/14/11 0503  CHOL 172  HDL 48  LDLCALC 102*  TRIG 109  CHOLHDL 3.6  LDLDIRECT --   Thyroid Function Tests: No results found for this basename: TSH,T4TOTAL,FREET3,T3FREE,THYROIDAB in the last 72 hours Anemia Panel: No results found for this basename: VITAMINB12,FOLATE,FERRITIN,TIBC,IRON,RETICCTPCT in the last 72 hours  TELE: SR   Radiology/Studies:  Dg Chest 2 View 07/13/2011  *RADIOLOGY REPORT*  Clinical Data: Chest pain  CHEST - 2 VIEW  Comparison:  None.  Findings:  The heart size and mediastinal contours are within normal limits.  Both lungs are clear.  The visualized skeletal structures are unremarkable.  IMPRESSION: No active cardiopulmonary disease.  Original Report Authenticated By: GRETCHEN E. GREEN, M.D.   Ct Angio Chest W/cm &/or Wo Cm 07/13/2011  *RADIOLOGY REPORT*  Clinical Data: Chest pain, altered mental status  CT ANGIOGRAPHY CHEST  Technique:  Multidetector CT imaging of the chest using the standard protocol during bolus administration of intravenous contrast. Multiplanar reconstructed images including MIPs were obtained and reviewed to evaluate the vascular anatomy.  Contrast: 80mL OMNIPAQUE IOHEXOL 300 MG/ML IV SOLN  Comparison: Chest radiographs dated 07/13/2011  Findings: No evidence of pulmonary embolism.  Mild dependent atelectasis in the bilateral lower lobes.    The lungs are otherwise clear.  No pleural effusion or pneumothorax.  The heart is normal in size.  No pericardial effusion.  No suspicious mediastinal, hilar, or axillary lymphadenopathy.  Visualized upper abdomen is notable for hepatic steatosis.  Visualized osseous structures are within normal limits.  IMPRESSION: No evidence of pulmonary embolism.  No evidence of acute cardiopulmonary disease.  Original Report Authenticated By: SRIYESH KRISHNAN, M.D.    Current Medications:     . acetaminophen  650 mg Oral Once  . aspirin      . aspirin  324  mg Oral Once  . aspirin EC  81 mg Oral Daily  . heparin  3,000 Units Intravenous Once  . heparin  4,000 Units Intravenous Once  . metoprolol tartrate  25 mg Oral BID  . rosuvastatin  40 mg Oral q1800  . sodium chloride  3 mL Intravenous Q12H  . sodium chloride  3 mL Intravenous Q12H    ASSESSMENT AND PLAN: 1. NSTEMI - f/u ez negative but symptoms concerning. Cath today, cont. ASA/hep/BB/statin. 2. Hepatic steatosis - ck LFTs 3. Dyslipidemia - cont. Statin 4. THC use - cessation encouraged. 5.  tobacco use - cessation consult   Signed, Rhonda Barrett , PA-C  History reviewed with the patient.  He did get chest pain again last night and received NTG SL.  He is pain free this am. The patient exam reveals no rub, no murmur.  Lungs clear.  No edema.  All available labs, radiology testing, previous records reviewed. Agree with documented assessment and plan.  Further enzymes are negative.  However, he has had continued pain.  Therefore, cath is indicated.   Isaia Hassell  9:07 AM 05/09/2011  

## 2011-07-14 NOTE — Progress Notes (Signed)
Nutrition Education  Pt admitted with MI, dyslipidemia, hepatic steatosis, THC use not on Crestor.  Drives a taxi for a living.  Considers himself active, walks and swims.  Regular diet prior to admit.  Usual body weight for the past 10 years is 245-260#.  Current weight is 264#.  Height 6'.  BMI=36.  Pt meets criteria for obesity.    Instructed pt on Heart Healthy diet as well as weight loss discussed.   Written info with RD name and number provided.   Oran Rein, RD [phone]

## 2011-07-14 NOTE — Progress Notes (Signed)
TR BAND REMOVAL  LOCATION:    right radial  DEFLATED PER PROTOCOL:    yes  TIME BAND OFF / DRESSING APPLIED:    2105   SITE UPON ARRIVAL:    Level 0  SITE AFTER BAND REMOVAL:    Level 0  REVERSE ALLEN'S TEST:     positive  CIRCULATION SENSATION AND MOVEMENT:    Within Normal Limits   yes  COMMENTS:   Some oozing reported during initial deflation during the day shift.  No issues since.  Site WNL. Pt is stable.

## 2011-07-14 NOTE — Op Note (Signed)
Cardiac Catheterization Procedure Note  Name: Ian Baker MRN: [id number] DOB: 1966-02-23  Procedure:  1.  Placement of catheters for angio without left heart cath 2.  Coronary arteriography  Indication: Chest pain with neg CT and pos troponin.    Procedural Details: The right wrist was prepped, draped, and anesthetized with 1% lidocaine. Using the modified Seldinger technique, a 5 French sheath was introduced into the right radial artery. 3 mg of verapamil was administered through the sheath, weight-based unfractionated heparin was administered intravenously. Standard Judkins catheters were used for selective coronary angiography. Because of a diagnostic echo study, LV angio was not performed.  Catheter exchanges were performed over an exchange length guidewire. There were no immediate procedural complications. A TR band was used for radial hemostasis at the completion of the procedure.  The patient was transferred to the post catheterization recovery area for further monitoring.  Procedural Findings: Hemodynamics: AO 108/71 LV not done  Coronary angiography: Coronary dominance: right  Left mainstem: Large in caliber, not calcified, and free of significant disease.  Left anterior descending (LAD): Courses to the apex and supplies the distal inferior wall as well.  No calcium.  The LAD proper appears from of disease, and is quite smooth.  There is a ramus and proximal diagonal that appear smooth, but overlap proximally in multiple views making clear delineation difficult.  However, there is no suggestion of high grade obstruction.    Left circumflex (LCx): Consists of a small OM1 and 2, and larger OM3.  No significant obstruction.   Right coronary artery (RCA): Provides a PDA and PLA.  No significant obstruction noted.   Left ventriculography: Not done  Final Conclusions:   1.  Normal LV function by echo--see report 2.  Smooth appearing coronaries without significant obstruction  noted.   Recommendations:  1.  Stop smoking.   2.  Risk factor reduction.    Shawnie Pons 07/14/2011, 5:31 PM

## 2011-07-14 NOTE — Progress Notes (Signed)
  Echocardiogram 2D Echocardiogram has been performed.  Ian Baker 07/14/2011, 9:48 AM

## 2011-07-14 NOTE — H&P (View-Only) (Signed)
Patient Name: Ian Baker Date of Encounter: 07/14/2011  Active Problems:  * No active hospital problems. *     SUBJECTIVE:STEMI   OBJECTIVE  Filed Vitals:   07/14/11 0400 07/14/11 0505 07/14/11 0600 07/14/11 0700  BP: 100/56 118/63 119/67   Pulse: 58 61 61   Temp: 97.9 F (36.6 C)     TempSrc: Oral     Resp: 0     Height:      Weight:    264 lb 8.8 oz (120 kg)  SpO2: 98% 100% 100%     Intake/Output Summary (Last 24 hours) at 07/14/11 0758 Last data filed at 07/14/11 0700  Gross per 24 hour  Intake  782.8 ml  Output    650 ml  Net  132.8 ml   Weight change:   PHYSICAL EXAM  General: Well developed, well nourished, in no acute distress. Head: Normocephalic, atraumatic.  Neck: Supple without bruits, no elevation in JVD. Lungs:  Resp regular and unlabored, CTA bilaterally. Heart: RRR, normal S1/S2 no s3, s4, or murmurs. Abdomen: Soft, non-tender, non-distended, BS + x 4.  Extremities: No clubbing, cyanosis, no edema.  Neuro: Alert and oriented X 3. Moves all extremities spontaneously. Psych: Normal affect.  LABS:  CBC: Basename 07/14/11 0503 07/13/11 0940  WBC 7.0 6.4  NEUTROABS -- 2.7  HGB 15.4 15.6  HCT 44.1 45.0  MCV 85.3 85.4  PLT 143* 152   Basic Metabolic Panel: Basename 07/14/11 0503 07/13/11 0940  NA 141 142  K 3.9 3.8  CL 105 104  CO2 29 28  GLUCOSE 106* 122*  BUN 8 11  CREATININE 1.11 1.07  CALCIUM 9.1 9.5  MG -- --  PHOS -- --   Liver Function Tests: No results found for this basename: AST:2,ALT:2,ALKPHOS:2,BILITOT:2,PROT:2,ALBUMIN:2 in the last 72 hours No results found for this basename: LIPASE:2,AMYLASE:2 in the last 72 hours  Cardiac Enzymes: Basename 07/14/11 0025 07/13/11 1727 07/13/11 1225  CKTOTAL 187 221 --  CKMB 2.1 2.1 --  CKMBINDEX -- -- --  TROPONINI <0.30 <0.30 0.49*   BNP: No components found with this basename: POCBNP:3  D-Dimer: Basename 07/13/11 0940  DDIMER 0.56*   Hemoglobin A1C:  Basename  07/13/11 2100  HGBA1C 5.8*   Fasting Lipid Panel:  Basename 07/14/11 0503  CHOL 172  HDL 48  LDLCALC 102*  TRIG 109  CHOLHDL 3.6  LDLDIRECT --   Thyroid Function Tests: No results found for this basename: TSH,T4TOTAL,FREET3,T3FREE,THYROIDAB in the last 72 hours Anemia Panel: No results found for this basename: VITAMINB12,FOLATE,FERRITIN,TIBC,IRON,RETICCTPCT in the last 72 hours  TELE: SR   Radiology/Studies:  Dg Chest 2 View 07/13/2011  *RADIOLOGY REPORT*  Clinical Data: Chest pain  CHEST - 2 VIEW  Comparison:  None.  Findings:  The heart size and mediastinal contours are within normal limits.  Both lungs are clear.  The visualized skeletal structures are unremarkable.  IMPRESSION: No active cardiopulmonary disease.  Original Report Authenticated By: Harrel Lemon, M.D.   Ct Angio Chest W/cm &/or Wo Cm 07/13/2011  *RADIOLOGY REPORT*  Clinical Data: Chest pain, altered mental status  CT ANGIOGRAPHY CHEST  Technique:  Multidetector CT imaging of the chest using the standard protocol during bolus administration of intravenous contrast. Multiplanar reconstructed images including MIPs were obtained and reviewed to evaluate the vascular anatomy.  Contrast: 80mL OMNIPAQUE IOHEXOL 300 MG/ML IV SOLN  Comparison: Chest radiographs dated 07/13/2011  Findings: No evidence of pulmonary embolism.  Mild dependent atelectasis in the bilateral lower lobes.  The lungs are otherwise clear.  No pleural effusion or pneumothorax.  The heart is normal in size.  No pericardial effusion.  No suspicious mediastinal, hilar, or axillary lymphadenopathy.  Visualized upper abdomen is notable for hepatic steatosis.  Visualized osseous structures are within normal limits.  IMPRESSION: No evidence of pulmonary embolism.  No evidence of acute cardiopulmonary disease.  Original Report Authenticated By: Charline Bills, M.D.    Current Medications:     . acetaminophen  650 mg Oral Once  . aspirin      . aspirin  324  mg Oral Once  . aspirin EC  81 mg Oral Daily  . heparin  3,000 Units Intravenous Once  . heparin  4,000 Units Intravenous Once  . metoprolol tartrate  25 mg Oral BID  . rosuvastatin  40 mg Oral q1800  . sodium chloride  3 mL Intravenous Q12H  . sodium chloride  3 mL Intravenous Q12H    ASSESSMENT AND PLAN: 1. NSTEMI - f/u ez negative but symptoms concerning. Cath today, cont. ASA/hep/BB/statin. 2. Hepatic steatosis - ck LFTs 3. Dyslipidemia - cont. Statin 4. THC use - cessation encouraged. 5.  tobacco use - cessation consult   Signed, Theodore Demark , PA-C  History reviewed with the patient.  He did get chest pain again last night and received NTG SL.  He is pain free this am. The patient exam reveals no rub, no murmur.  Lungs clear.  No edema.  All available labs, radiology testing, previous records reviewed. Agree with documented assessment and plan.  Further enzymes are negative.  However, he has had continued pain.  Therefore, cath is indicated.   Fayrene Fearing Jette Lewan  9:07 AM 05/09/2011

## 2011-07-14 NOTE — Progress Notes (Signed)
Pt smokes 1 cigar a month and says he plans to quit. Advised and encouraged pt to quit. Pt in action stage. Voices understanding of risk factors of smoking. Referred to 1-800 quit now for f/u and support. Discussed oral fixation substitutes, second hand smoke and in home smoking policy. Reviewed and gave pt Written education/contact information.

## 2011-07-14 NOTE — Progress Notes (Signed)
46yo male now therapeutic on heparin (level 0.5, goal 0.3-0.7).  Scheduled for cath at noon today.  Will continue to follow.  Vernard Gambles, PharmD, BCPS 07/14/2011 5:53 AM

## 2011-07-14 NOTE — Interval H&P Note (Signed)
History and Physical Interval Note:  07/14/2011 4:28 PM  Ian Baker  has presented today for surgery, with the diagnosis of cp with elevated troponin after marijuana laced brownie.  Seen by Dr. Antoine Poche and decision made to proceed wit ht cardiac catheterizaiton.   The procedural risks have been discussed with the patient. After consideration of risks, benefits and other options for treatment, the patient has consented to  Procedure(s): LEFT HEART CATHETERIZATION WITH CORONARY ANGIOGRAM as a surgical intervention .  The patients' history has been reviewed, patient examined, no change in status, stable for surgery.  I have reviewed the patients' chart and labs.  Questions were answered to the patient's satisfaction.     Shawnie Pons, MD, [facility], [place] 4:29 PM 07/14/2011

## 2011-07-15 ENCOUNTER — Other Ambulatory Visit: Payer: Self-pay

## 2011-07-15 ENCOUNTER — Encounter (HOSPITAL_COMMUNITY): Payer: Self-pay | Admitting: Physician Assistant

## 2011-07-15 DIAGNOSIS — Z720 Tobacco use: Secondary | ICD-10-CM | POA: Insufficient documentation

## 2011-07-15 DIAGNOSIS — D696 Thrombocytopenia, unspecified: Secondary | ICD-10-CM | POA: Insufficient documentation

## 2011-07-15 DIAGNOSIS — F1210 Cannabis abuse, uncomplicated: Secondary | ICD-10-CM | POA: Insufficient documentation

## 2011-07-15 DIAGNOSIS — R739 Hyperglycemia, unspecified: Secondary | ICD-10-CM | POA: Insufficient documentation

## 2011-07-15 DIAGNOSIS — R079 Chest pain, unspecified: Secondary | ICD-10-CM | POA: Insufficient documentation

## 2011-07-15 LAB — CBC
Hemoglobin: 14.1 g/dL (ref 13.0–17.0)
MCH: 28.5 pg (ref 26.0–34.0)
MCHC: 33.4 g/dL (ref 30.0–36.0)
MCV: 85.4 fL (ref 78.0–100.0)

## 2011-07-15 NOTE — Progress Notes (Signed)
   CARE MANAGEMENT NOTE 07/15/2011  Patient:  Ian Baker, Ian Baker   Account Number:  [id number]  Date Initiated:  07/15/2011  Documentation initiated by:  GRAVES-BIGELOW,Gladys Gutman  Subjective/Objective Assessment:   Chest pain without evidence for CAD  - no CAD by cath 07/14/11, normal LV function by echo this admission.     Action/Plan:   No new meds for home at this time. No needs assessed.   Anticipated DC Date:  07/15/2011   Anticipated DC Plan:  HOME/SELF CARE      DC Planning Services  CM consult      Choice offered to / List presented to:             Status of service:  Completed, signed off Medicare Important Message given?   (If response is "NO", the following Medicare IM given date fields will be blank) Date Medicare IM given:   Date Additional Medicare IM given:    Discharge Disposition:  HOME/SELF CARE  Per UR Regulation:    Comments:

## 2011-07-15 NOTE — Discharge Summary (Signed)
Discharge Summary   Patient ID: Ian Baker MRN: [id number], DOB/AGE: 47-Oct-1967 47 y.o. Admit date: 07/13/2011 D/C date:     07/15/2011   Primary Discharge Diagnoses:  1. Chest pain without evidence for CAD  - no CAD by cath 07/14/11, normal LV function by echo this admission - no PE by CT Angio 07/13/11 2. Hyperglycemia with A1C of 5.8 3. Thrombocytopenia (plt 143) 4. Polysubstance abuse with tobacco, maijuana  Secondary Discharge Diagnoses:  1. Hepatic steatosis 2. Arthritis 3. Overweight 4. Hx of appendectomy  Hospital Course: 46 y/o M without prior cardiac history presented to [facility] complaining of tachypalps with assoc chest burning and tightness and dyspnea following eating a brownie laced with maijuana. In the ER, patient continued to have CP/palps and was noted to be in sinus rhythm with HR in the 90's. He was given ASA. D-dimer was mildly elevated at 0.56; CTA demonstrated no PE. First troponin was negative but second POC marker was slightly elevated at 0.49. He was admitted to the cardiology service for further evaluation. Subsequent cardiac markers were negative x 2 full sets. He underwent cath 07/14/11 to define his anatomy and thus showed smooth appearing coronaries without significant obstruction noted. 2D echo showed normal LV function with no WMA. Risk factor modification including cessation of substance abuse was advised. No further cardiac workup was felt necessary. Dr. Antoine Poche has seen and examined Ian Baker today and feels he is stable for discharge. Ian Baker was instructed to follow up with his primary care doctor to follow his platelet count which was mildly decreased, blood sugar which was elevated (A1C 5.8), cholesterol (in absence of CAD, he is not yet hyperlipidemic given LDL of 102 and patient has hx of hepatic steatosis), liver function, & blood pressure.   Discharge Vitals: Blood pressure 113/66, pulse 64, temperature 98 F (36.7 C), temperature  source Oral, resp. rate 15, height 6' (1.829 m), weight 266 lb 8.6 oz (120.9 kg), SpO2 99.00%.  Labs: Lab Results  Component Value Date   WBC 5.2 07/15/2011   HGB 14.1 07/15/2011   HCT 42.2 07/15/2011   MCV 85.4 07/15/2011   PLT 138* 07/15/2011    Lab 07/14/11 0503  NA 141  K 3.9  CL 105  CO2 29  BUN 8  CREATININE 1.11  CALCIUM 9.1  PROT 7.0  BILITOT 0.5  ALKPHOS 37*  ALT 39  AST 27  GLUCOSE 106*    Basename 07/14/11 0025 07/13/11 1727 07/13/11 1225 07/13/11 0940  CKTOTAL 187 221 -- --  CKMB 2.1 2.1 -- --  TROPONINI <0.30 <0.30 0.49* <0.30   Lab Results  Component Value Date   CHOL 172 07/14/2011   HDL 48 07/14/2011   LDLCALC 102* 07/14/2011   TRIG 109 07/14/2011   Lab Results  Component Value Date   DDIMER 0.56* 07/13/2011    Diagnostic Studies/Procedures   1. 07/14/11 Echo Study Conclusions Left ventricle: The cavity size was normal. Wall thickness was normal. Systolic function was normal. The estimated ejection fraction was in the range of 55% to 60%. Wall motion was normal; there were no regional wall motion abnormalities.   2. CXR 2 View 07/13/2011  *RADIOLOGY REPORT*  Clinical Data: Chest pain  CHEST - 2 VIEW  Comparison:  None.  Findings:  The heart size and mediastinal contours are within normal limits.  Both lungs are clear.  The visualized skeletal structures are unremarkable.  IMPRESSION: No active cardiopulmonary disease.    3. Ct Angio Chest W/cm &/  or Wo Cm 07/13/2011  IMPRESSION: No evidence of pulmonary embolism.  No evidence of acute cardiopulmonary disease.  Original Report Authenticated By: Charline Bills, M.D.   4. Cardiac catheterization this admission, please see full report and above for summary.   Discharge Medications   Medication List  As of 07/15/2011  7:39 AM   TAKE these medications         Aspirin-Caffeine 845-65 MG Pack   Take 2 packets by mouth 2 (two) times daily as needed. For pain      ibuprofen 200 MG tablet   Commonly known as:  ADVIL,MOTRIN   Take 200 mg by mouth every 6 (six) hours as needed. For pain      MOTRIN PM 200-38 MG Tabs   Generic drug: Ibuprofen-Diphenhydramine Cit   Take 0.5 tablets by mouth at bedtime as needed. For sleep            Disposition   The patient will be discharged in stable condition to home. Discharge Orders    Future Orders Please Complete By Expires   Diet - low sodium heart healthy      Increase activity slowly      Comments:   No driving for 2 days. No lifting over 5 lbs for 1 week. No sexual activity for 1 week. You may return to work on 07/17/11. Keep procedure site clean & dry. If you notice increased pain, swelling, bleeding or pus, call/return!  You may shower, but no soaking baths/hot tubs/pools for 1 week.     Discharge instructions      Comments:   Stop using marijuana and tobacco products.     Follow-up Information    Follow up with Primary Care Doctor in 2 weeks. (Please continue to follow up with your primary doctor to monitor your blood sugar which was mildly elevated here in the hospital, evaluate your blood platelet count which was slightly decreased, &  follow your cholesterol. )            Duration of Discharge Encounter: Greater than 30 minutes including physician and PA time.  Signed, Ronie Spies PA-C 07/15/2011, 7:39 AM  Patient seen and examined.  Plan as discussed in my rounding note for today and outlined above. Rollene Rotunda  07/15/2011  8:22 AM

## 2011-07-15 NOTE — Progress Notes (Signed)
   SUBJECTIVE:  No chest pain.  No SOB   PHYSICAL EXAM Filed Vitals:   07/14/11 2100 07/14/11 2212 07/15/11 0011 07/15/11 0507  BP: 109/66 109/62 121/71 113/66  Pulse: 58 74 62 64  Temp:   98.2 F (36.8 C) 98 F (36.7 C)  TempSrc:   Oral Oral  Resp:      Height:      Weight:      SpO2: 97%  99% 99%   General:  No distress Lungs:  Clear Heart:  RRR, no rub Abdomen:  Positive bowel sounds, no rebound no guarding Extremities:  Right radial site OK.  No edema.  LABS: Lab Results  Component Value Date   CKTOTAL 187 07/14/2011   CKMB 2.1 07/14/2011   TROPONINI <0.30 07/14/2011   Results for orders placed during the hospital encounter of 07/13/11 (from the past 24 hour(s))  CBC     Status: Abnormal   Collection Time   07/15/11  4:40 AM      Component Value Range   WBC 5.2  4.0 - 10.5 (K/uL)   RBC 4.94  4.22 - 5.81 (MIL/uL)   Hemoglobin 14.1  13.0 - 17.0 (g/dL)   HCT 36.6  44.0 - 34.7 (%)   MCV 85.4  78.0 - 100.0 (fL)   MCH 28.5  26.0 - 34.0 (pg)   MCHC 33.4  30.0 - 36.0 (g/dL)   RDW 42.5  95.6 - 38.7 (%)   Platelets 138 (*) 150 - 400 (K/uL)    Intake/Output Summary (Last 24 hours) at 07/15/11 0625 Last data filed at 07/15/11 5643  Gross per 24 hour  Intake   2570 ml  Output   1701 ml  Net    869 ml    ASSESSMENT AND PLAN:  Chest pain:  Normal coronaries.  Echo ok.  No further work up.  Substance use:  Educated.  Thrombocytopenia:  Mild.  He should have repeat labs with his primary care physician.  Discharge to follow up with primary phsycian  Rollene Rotunda 07/15/2011 6:25 AM

## 2014-06-01 ENCOUNTER — Encounter (HOSPITAL_COMMUNITY): Payer: Self-pay | Admitting: Cardiology

## 2015-07-26 ENCOUNTER — Emergency Department (HOSPITAL_COMMUNITY): Payer: Self-pay

## 2015-07-26 ENCOUNTER — Encounter (HOSPITAL_COMMUNITY): Payer: Self-pay

## 2015-07-26 ENCOUNTER — Emergency Department (HOSPITAL_COMMUNITY)
Admission: EM | Admit: 2015-07-26 | Discharge: 2015-07-26 | Disposition: A | Discharge status: Home / Self Care | Payer: Self-pay | Attending: Emergency Medicine | Admitting: Emergency Medicine

## 2015-07-26 DIAGNOSIS — Z8719 Personal history of other diseases of the digestive system: Secondary | ICD-10-CM | POA: Insufficient documentation

## 2015-07-26 DIAGNOSIS — M1990 Unspecified osteoarthritis, unspecified site: Secondary | ICD-10-CM | POA: Insufficient documentation

## 2015-07-26 DIAGNOSIS — Z862 Personal history of diseases of the blood and blood-forming organs and certain disorders involving the immune mechanism: Secondary | ICD-10-CM | POA: Insufficient documentation

## 2015-07-26 DIAGNOSIS — R42 Dizziness and giddiness: Secondary | ICD-10-CM | POA: Insufficient documentation

## 2015-07-26 DIAGNOSIS — R51 Headache: Secondary | ICD-10-CM | POA: Insufficient documentation

## 2015-07-26 DIAGNOSIS — E663 Overweight: Secondary | ICD-10-CM | POA: Insufficient documentation

## 2015-07-26 DIAGNOSIS — R531 Weakness: Secondary | ICD-10-CM | POA: Insufficient documentation

## 2015-07-26 DIAGNOSIS — Z9889 Other specified postprocedural states: Secondary | ICD-10-CM | POA: Insufficient documentation

## 2015-07-26 DIAGNOSIS — F17210 Nicotine dependence, cigarettes, uncomplicated: Secondary | ICD-10-CM | POA: Insufficient documentation

## 2015-07-26 DIAGNOSIS — R519 Headache, unspecified: Secondary | ICD-10-CM

## 2015-07-26 LAB — I-STAT TROPONIN, ED: TROPONIN I, POC: 0 ng/mL (ref 0.00–0.08)

## 2015-07-26 LAB — BASIC METABOLIC PANEL
Anion gap: 10 (ref 5–15)
BUN: 9 mg/dL (ref 6–20)
CALCIUM: 9.2 mg/dL (ref 8.9–10.3)
CHLORIDE: 106 mmol/L (ref 101–111)
CO2: 24 mmol/L (ref 22–32)
Creatinine, Ser: 1.07 mg/dL (ref 0.61–1.24)
GFR calc Af Amer: >60 mL/min (ref >60)
GFR calc non Af Amer: >60 mL/min (ref >60)
GLUCOSE: 125 mg/dL — AB (ref 65–99)
Potassium: 4 mmol/L (ref 3.5–5.1)
Sodium: 140 mmol/L (ref 135–145)

## 2015-07-26 LAB — CBC
HEMATOCRIT: 44.4 % (ref 39.0–52.0)
HEMOGLOBIN: 15.8 g/dL (ref 13.0–17.0)
MCH: 30.3 pg (ref 26.0–34.0)
MCHC: 35.6 g/dL (ref 30.0–36.0)
MCV: 85.1 fL (ref 78.0–100.0)
Platelets: 156 10*3/uL (ref 150–400)
RBC: 5.22 MIL/uL (ref 4.22–5.81)
RDW: 12.9 % (ref 11.5–15.5)
WBC: 5.8 10*3/uL (ref 4.0–10.5)

## 2015-07-26 MED ORDER — METOCLOPRAMIDE HCL 10 MG PO TABS
10.0000 mg | ORAL_TABLET | Freq: Once | ORAL | Status: AC
  Administered 2015-07-26: 10 mg via ORAL
  Filled 2015-07-26: qty 1

## 2015-07-26 MED ORDER — KETOROLAC TROMETHAMINE 60 MG/2ML IM SOLN
60.0000 mg | Freq: Once | INTRAMUSCULAR | Status: AC
Start: 2015-07-26 — End: 2015-07-26
  Administered 2015-07-26: 60 mg via INTRAMUSCULAR
  Filled 2015-07-26: qty 2

## 2015-07-26 NOTE — ED Notes (Signed)
Pt states he ate something with a lot of salt in it earlier and every time he does he gets a bad headache and felt like his heart was racing earlier.

## 2015-07-26 NOTE — ED Notes (Signed)
Pt states he does not have chest pain at this time. Does c/o mild headache

## 2015-07-26 NOTE — Discharge Instructions (Signed)
General Headache Without Cause Ian Baker, take Tylenol or ibuprofen as needed for headaches. See a primary care physician continues close follow-up of her high blood pressure. If any symptoms worsen come back to emergency department immediately. Thank you. A headache is pain or discomfort felt around the head or neck area. There are many causes and types of headaches. In some cases, the cause may not be found.  HOME CARE  Managing Pain  Take over-the-counter and prescription medicines only as told by your doctor.  Lie down in a dark, quiet room when you have a headache.  If directed, apply ice to the head and neck area:  Put ice in a plastic bag.  Place a towel between your skin and the bag.  Leave the ice on for 20 minutes, 2-3 times per day.  Use a heating pad or hot shower to apply heat to the head and neck area as told by your doctor.  Keep lights dim if bright lights bother you or make your headaches worse. Eating and Drinking  Eat meals on a regular schedule.  Lessen how much alcohol you drink.  Lessen how much caffeine you drink, or stop drinking caffeine. General Instructions  Keep all follow-up visits as told by your doctor. This is important.  Keep a journal to find out if certain things bring on headaches. For example, write down:  What you eat and drink.  How much sleep you get.  Any change to your diet or medicines.  Relax by getting a massage or doing other relaxing activities.  Lessen stress.  Sit up straight. Do not tighten (tense) your muscles.  Do not use tobacco products. This includes cigarettes, chewing tobacco, or e-cigarettes. If you need help quitting, ask your doctor.  Exercise regularly as told by your doctor.  Get enough sleep. This often means 7-9 hours of sleep. GET HELP IF:  Your symptoms are not helped by medicine.  You have a headache that feels different than the other headaches.  You feel sick to your stomach (nauseous) or  you throw up (vomit).  You have a fever. GET HELP RIGHT AWAY IF:   Your headache becomes really bad.  You keep throwing up.  You have a stiff neck.  You have trouble seeing.  You have trouble speaking.  You have pain in the eye or ear.  Your muscles are weak or you lose muscle control.  You lose your balance or have trouble walking.  You feel like you will pass out (faint) or you pass out.  You have confusion.   This information is not intended to replace advice given to you by your health care provider. Make sure you discuss any questions you have with your health care provider.   Document Released: 03/18/2008 Document Revised: 02/28/2015 Document Reviewed: 10/02/2014 Elsevier Interactive Patient Education [employer]

## 2015-07-26 NOTE — ED Provider Notes (Signed)
CSN: [id number]     Arrival date & time 07/26/15  4098 History  By signing my name below, I, Freida Busman, attest that this documentation has been prepared under the direction and in the presence of Tomasita Crumble, MD . Electronically Signed: Freida Busman, Scribe. 07/26/2015. 3:30 AM.    Chief Complaint  Patient presents with  . Hypertension  . Chest Pain    The history is provided by the patient. No language interpreter was used.    HPI Comments:  Telesforo Brosnahan is a 50 y.o. male who presents to the Emergency Department complaining of HA with moderate pain  and palpitations, intermittently x 2 years. He notes the episode seems to occur after eating foods high in sodium or spicy. He also feels as though his BP is elevated with these episodes. He denies h/o HTN. Pt states his HAs last ~ 6-8 hours.  He also reports associated generalized weakness and lighheadedness. He denies CP, abdominal pain, focal weakness, and numbness to his extremities. No alleviating factors noted.  Past Medical History  Diagnosis Date  . Overweight(278.02)   . Tobacco abuse     1 thin cigar every few months  . Marijuana abuse     smokes about 1x/month  . Arthritis   . Chest pain     Normal coronary arteries 07/14/11 with normal LV function by echo at that time, negative for PE by CT angio 07/13/11  . Hyperglycemia     A1C 5.8  . Hepatic steatosis   . Thrombocytopenia (HCC)     Noted 1/13 hospitalization - to f/u with PCP   Past Surgical History  Procedure Laterality Date  . Appendectomy      20005  . Left heart catheterization with coronary angiogram N/A 07/14/2011    Procedure: LEFT HEART CATHETERIZATION WITH CORONARY ANGIOGRAM;  Surgeon: Herby Abraham, MD;  Location: [facility] CATH LAB;  Service: Cardiovascular;  Laterality: N/A;   Family History  Problem Relation Age of Onset  . Diabetes Mother   . Coronary artery disease Mother     onset 77's, currently 38   Social History  Substance Use Topics  .  Smoking status: Current Some Day Smoker    Types: Cigars  . Smokeless tobacco: Current User     Comment: smokes 1 cigar every month  . Alcohol Use: 1.2 oz/week    2 Cans of beer per week     Comment: has a few drinks/month    Review of Systems  10 systems reviewed and all are negative for acute change except as noted in the HPI.   Allergies  Review of patient's allergies indicates no known allergies.  Home Medications   Prior to Admission medications   Medication Sig Start Date End Date Taking? Authorizing Provider  Aspirin-Caffeine 845-65 MG PACK Take 2 packets by mouth 2 (two) times daily as needed. For pain    Historical Provider, MD  ibuprofen (ADVIL,MOTRIN) 200 MG tablet Take 200 mg by mouth every 6 (six) hours as needed. For pain    Historical Provider, MD  Ibuprofen-Diphenhydramine Cit (MOTRIN PM) 200-38 MG TABS Take 0.5 tablets by mouth at bedtime as needed. For sleep    Historical Provider, MD   BP 147/104 mmHg  Pulse 78  Temp(Src) 98 F (36.7 C) (Oral)  Resp 20  Ht 6' (1.829 m)  Wt 270 lb (122.471 kg)  BMI 36.61 kg/m2  SpO2 97% Physical Exam  Constitutional: He is oriented to person, place, and time. Vital signs  are normal. He appears well-developed and well-nourished.  Non-toxic appearance. He does not appear ill. No distress.  HENT:  Head: Normocephalic and atraumatic.  Nose: Nose normal.  Mouth/Throat: Oropharynx is clear and moist. No oropharyngeal exudate.  Eyes: Conjunctivae and EOM are normal. Pupils are equal, round, and reactive to light. No scleral icterus.  Neck: Normal range of motion. Neck supple. No tracheal deviation, no edema, no erythema and normal range of motion present. No thyroid mass and no thyromegaly present.  Cardiovascular: Normal rate, regular rhythm, S1 normal, S2 normal, normal heart sounds, intact distal pulses and normal pulses.  Exam reveals no gallop and no friction rub.   No murmur heard. Pulmonary/Chest: Effort normal and breath  sounds normal. No respiratory distress. He has no wheezes. He has no rhonchi. He has no rales.  Abdominal: Soft. Normal appearance and bowel sounds are normal. He exhibits no distension, no ascites and no mass. There is no hepatosplenomegaly. There is no tenderness. There is no rebound, no guarding and no CVA tenderness.  Musculoskeletal: Normal range of motion. He exhibits no edema or tenderness.  Lymphadenopathy:    He has no cervical adenopathy.  Neurological: He is alert and oriented to person, place, and time. He has normal strength. No cranial nerve deficit or sensory deficit.  Skin: Skin is warm, dry and intact. No petechiae and no rash noted. He is not diaphoretic. No erythema. No pallor.  Psychiatric: He has a normal mood and affect. His behavior is normal. Judgment normal.  Nursing note and vitals reviewed.   ED Course  Procedures  DIAGNOSTIC STUDIES:  Oxygen Saturation is 97% on RA, normal by my interpretation.    COORDINATION OF CARE:  3:30 AM Discussed treatment plan with pt at bedside and pt agreed to plan.  Labs Review Labs Reviewed  BASIC METABOLIC PANEL - Abnormal; Notable for the following:    Glucose, Bld 125 (*)    All other components within normal limits  CBC  I-STAT TROPOININ, ED    Imaging Review Dg Chest 2 View  07/26/2015  CLINICAL DATA:  Mid chest pain tonight. EXAM: CHEST  2 VIEW COMPARISON:  Radiographs and CT 07/13/2011 FINDINGS: The cardiomediastinal contours are normal. The lungs are clear. Pulmonary vasculature is normal. No consolidation, pleural effusion, or pneumothorax. No acute osseous abnormalities are seen. IMPRESSION: No acute pulmonary process. Electronically Signed   By: Rubye Oaks M.D.   On: 07/26/2015 02:58   I have personally reviewed and evaluated these images and lab results as part of my medical decision-making.   EKG Interpretation   Date/Time:  Thursday July 26 2015 02:39:43 EST Ventricular Rate:  71 PR Interval:   142 QRS Duration: 86 QT Interval:  386 QTC Calculation: 419 R Axis:   -132 Text Interpretation:  Normal sinus rhythm Inferior infarct , age  undetermined Abnormal ECG No significant change since last tracing  Confirmed by Erroll Luna [phone]) on 07/26/2015 4:40:57 AM      MDM   Final diagnoses:  None   Patient presents to the emergency department for headache. He is concerned that eating salt causes high blood pressure which causes headache. Patient was educated that increase oral intake can raise his blood pressure. Studies have shown however that high blood pressure does not correlate with headaches.. His blood pressures come back to normal at 117/72 without any intervention. He was advised on dietary and lifestyle changes. Primary care follow-up is advised. He appears well in no acute distress, he is  asymptomatic from his blood pressure. Vital signs were within his normal limits and he is safe for discharge. Patient's headache was improved after Toradol and Reglan.    I personally performed the services described in this documentation, which was scribed in my presence. The recorded information has been reviewed and is accurate.      Tomasita Crumble, MD 07/26/15 0600

## 2015-07-26 NOTE — ED Notes (Signed)
Patient transported to X-ray, radiology notified to bring pt to treatment room

## 2015-11-19 ENCOUNTER — Emergency Department (HOSPITAL_COMMUNITY)
Admission: EM | Admit: 2015-11-19 | Discharge: 2015-11-19 | Disposition: A | Discharge status: Home / Self Care | Payer: Self-pay | Attending: Emergency Medicine | Admitting: Emergency Medicine

## 2015-11-19 ENCOUNTER — Encounter (HOSPITAL_COMMUNITY): Payer: Self-pay | Admitting: *Deleted

## 2015-11-19 DIAGNOSIS — E663 Overweight: Secondary | ICD-10-CM | POA: Insufficient documentation

## 2015-11-19 DIAGNOSIS — Z7982 Long term (current) use of aspirin: Secondary | ICD-10-CM | POA: Insufficient documentation

## 2015-11-19 DIAGNOSIS — Z8719 Personal history of other diseases of the digestive system: Secondary | ICD-10-CM | POA: Insufficient documentation

## 2015-11-19 DIAGNOSIS — J029 Acute pharyngitis, unspecified: Secondary | ICD-10-CM | POA: Insufficient documentation

## 2015-11-19 DIAGNOSIS — Z9889 Other specified postprocedural states: Secondary | ICD-10-CM | POA: Insufficient documentation

## 2015-11-19 DIAGNOSIS — F17210 Nicotine dependence, cigarettes, uncomplicated: Secondary | ICD-10-CM | POA: Insufficient documentation

## 2015-11-19 DIAGNOSIS — M1990 Unspecified osteoarthritis, unspecified site: Secondary | ICD-10-CM | POA: Insufficient documentation

## 2015-11-19 DIAGNOSIS — Z862 Personal history of diseases of the blood and blood-forming organs and certain disorders involving the immune mechanism: Secondary | ICD-10-CM | POA: Insufficient documentation

## 2015-11-19 MED ORDER — OMEPRAZOLE 20 MG PO CPDR: 20.0000 mg | DELAYED_RELEASE_CAPSULE | Freq: Every day | ORAL | Status: DC

## 2015-11-19 MED ORDER — LORATADINE 10 MG PO TABS: 10.0000 mg | ORAL_TABLET | Freq: Every day | ORAL | Status: DC

## 2015-11-19 MED ORDER — FLUTICASONE PROPIONATE 50 MCG/ACT NA SUSP: 2.0000 | Freq: Every day | NASAL | Status: DC

## 2015-11-19 NOTE — Discharge Instructions (Signed)

## 2015-11-19 NOTE — ED Provider Notes (Signed)
CSN: [id number]     Arrival date & time 11/19/15  0801 History   First MD Initiated Contact with Patient 11/19/15 [phone]     Chief Complaint  Patient presents with  . Sore Throat    HPI   50 year old male presents today with complaints of sore throat. Patient reports approximately 3 weeks ago he woke up with a sore throat. Patient reports that it before he was out fishing in cold weather. Patient reports a history of allergies, mild intermittent reflux, denies any smoking history, weight loss, fever, neck stiffness, difficulty swallowing or breathing. She notes that the throat is sore throughout the day, he has tried antihistamines without significant improvement in his symptoms. Patient has not had any symptoms similar previously. He denies any weight loss, history of cancer.   Past Medical History  Diagnosis Date  . Overweight(278.02)   . Tobacco abuse     1 thin cigar every few months  . Marijuana abuse     smokes about 1x/month  . Arthritis   . Chest pain     Normal coronary arteries 07/14/11 with normal LV function by echo at that time, negative for PE by CT angio 07/13/11  . Hyperglycemia     A1C 5.8  . Hepatic steatosis   . Thrombocytopenia (HCC)     Noted 1/13 hospitalization - to f/u with PCP   Past Surgical History  Procedure Laterality Date  . Appendectomy      20005  . Left heart catheterization with coronary angiogram N/A 07/14/2011    Procedure: LEFT HEART CATHETERIZATION WITH CORONARY ANGIOGRAM;  Surgeon: Herby Abrahamhomas D Stuckey, MD;  [facility];  Service: Cardiovascular;  Laterality: N/A;   Family History  Problem Relation Age of Onset  . Diabetes Mother   . Coronary artery disease Mother     onset 3450's, currently 8065   Social History  Substance Use Topics  . Smoking status: Current Some Day Smoker    Types: Cigars  . Smokeless tobacco: Current User     Comment: smokes 1 cigar every month  . Alcohol Use: 1.2 oz/week    2 Cans of beer per week   Comment: has a few drinks/month    Review of Systems  All other systems reviewed and are negative.   Allergies  Review of patient's allergies indicates no known allergies.  Home Medications   Prior to Admission medications   Medication Sig Start Date End Date Taking? Authorizing Provider  aspirin EC 81 MG tablet Take 81-162 mg by mouth every 4 (four) hours as needed for mild pain.    Historical Provider, MD  fluticasone (FLONASE) 50 MCG/ACT nasal spray Place 2 sprays into both nostrils daily. 11/19/15   Eyvonne MechanicJeffrey Emmalise Huard, PA-C  ibuprofen (ADVIL,MOTRIN) 200 MG tablet Take 200 mg by mouth every 6 (six) hours as needed for moderate pain. For pain    Historical Provider, MD  loratadine (CLARITIN) 10 MG tablet Take 1 tablet (10 mg total) by mouth daily. 11/19/15   Eyvonne MechanicJeffrey Madysun Thall, PA-C  omeprazole (PRILOSEC) 20 MG capsule Take 1 capsule (20 mg total) by mouth daily. 11/19/15   Eyvonne MechanicJeffrey Ardell Makarewicz, PA-C   BP 127/77 mmHg  Pulse 79  Temp(Src) 98.8 F (37.1 C) (Oral)  Resp 18  Ht 6' (1.829 m)  Wt 120.203 kg  BMI 35.93 kg/m2  SpO2 99%   Physical Exam  Constitutional: He is oriented to person, place, and time. He appears well-developed and well-nourished.  HENT:  Head: Normocephalic and atraumatic.  Minor amount of erythema to posterior oropharynx, no exudate, no vesicles, no swelling, no post pharyngeal edema, uvula midline with no swelling  Eyes: Conjunctivae are normal. Pupils are equal, round, and reactive to light. Right eye exhibits no discharge. Left eye exhibits no discharge. No scleral icterus.  Neck: Normal range of motion. No JVD present. No tracheal deviation present.  Pulmonary/Chest: Effort normal and breath sounds normal. No stridor. No respiratory distress. He has no wheezes. He has no rales. He exhibits no tenderness.  Neurological: He is alert and oriented to person, place, and time. Coordination normal.  Skin: Skin is warm and dry. No erythema. No pallor.  Psychiatric: He has a  normal mood and affect. His behavior is normal. Judgment and thought content normal.  Nursing note and vitals reviewed.   ED Course  Procedures (including critical care time) Labs Review Labs Reviewed - No data to display  Imaging Review No results found. I have personally reviewed and evaluated these images and lab results as part of my medical decision-making.   EKG Interpretation None      MDM   Final diagnoses:  Sore throat    Labs:   Imaging:  Consults:  Therapeutics:  Discharge Meds:   Assessment/Plan: Pt presents with Sore throat. He has no signs of infectious etiology, differential includes acid reflux, seasonal allergies. Patient has no red flags, he will be discharged home on Prilosec, omeprazole, Flonase, Claritin. He is instructed to take these for 2 weeks, if no symptomatic improvement follow-up with ENT for further evaluation. Patient verbalized understanding and agreement today's plan had no further questions or concerns at time of discharge.         Eyvonne Mechanic, PA-C 11/19/15 7829  Rolland Porter, MD 11/29/15 2308

## 2015-11-19 NOTE — ED Notes (Signed)
Pt reports 3 week Hx of sore throat that started after he was fishing and was caught in a rain storm. Pt reports dry cough.

## 2015-11-19 NOTE — ED Notes (Signed)
Declined W/C at D/C and was escorted to lobby by RN. 

## 2019-08-08 ENCOUNTER — Encounter (HOSPITAL_COMMUNITY): Payer: Self-pay

## 2019-08-08 ENCOUNTER — Emergency Department (HOSPITAL_COMMUNITY)
Admission: EM | Admit: 2019-08-08 | Discharge: 2019-08-09 | Disposition: A | Discharge status: Home / Self Care | Payer: Self-pay | Attending: Emergency Medicine | Admitting: Emergency Medicine

## 2019-08-08 DIAGNOSIS — R519 Headache, unspecified: Secondary | ICD-10-CM | POA: Insufficient documentation

## 2019-08-08 DIAGNOSIS — R42 Dizziness and giddiness: Secondary | ICD-10-CM | POA: Insufficient documentation

## 2019-08-08 DIAGNOSIS — F17290 Nicotine dependence, other tobacco product, uncomplicated: Secondary | ICD-10-CM | POA: Insufficient documentation

## 2019-08-08 LAB — BASIC METABOLIC PANEL
Anion gap: 8 (ref 5–15)
BUN: 8 mg/dL (ref 6–20)
CO2: 22 mmol/L (ref 22–32)
Calcium: 8.8 mg/dL — ABNORMAL LOW (ref 8.9–10.3)
Chloride: 110 mmol/L (ref 98–111)
Creatinine, Ser: 0.98 mg/dL (ref 0.61–1.24)
GFR calc Af Amer: >60 mL/min (ref >60)
GFR calc non Af Amer: >60 mL/min (ref >60)
Glucose, Bld: 101 mg/dL — ABNORMAL HIGH (ref 70–99)
Potassium: 3.6 mmol/L (ref 3.5–5.1)
Sodium: 140 mmol/L (ref 135–145)

## 2019-08-08 LAB — CBC
HCT: 44.6 % (ref 39.0–52.0)
Hemoglobin: 15.4 g/dL (ref 13.0–17.0)
MCH: 30.1 pg (ref 26.0–34.0)
MCHC: 34.5 g/dL (ref 30.0–36.0)
MCV: 87.1 fL (ref 80.0–100.0)
Platelets: 159 10*3/uL (ref 150–400)
RBC: 5.12 MIL/uL (ref 4.22–5.81)
RDW: 12.6 % (ref 11.5–15.5)
WBC: 5.5 10*3/uL (ref 4.0–10.5)
nRBC: 0 % (ref 0.0–0.2)

## 2019-08-08 MED ORDER — SODIUM CHLORIDE 0.9% FLUSH: 3.0000 mL | Freq: Once | INTRAVENOUS | Status: DC

## 2019-08-08 NOTE — ED Triage Notes (Signed)
Pt reports that he has been having dizziness for the past several days, room spinning and generalized malaise, pt is also concerned that he may be diabetic

## 2019-08-09 NOTE — Discharge Instructions (Addendum)
You were seen today for dizziness.  Your dizziness seems to be related to eating.  Avoid high carbohydrate meals as your dizziness may be related to low blood pressure after eating which can be related to high carbohydrate intake.  Follow-up with your primary physician.

## 2019-08-09 NOTE — ED Provider Notes (Signed)
[facility] EMERGENCY DEPARTMENT Provider Note   CSN: [id number] Arrival date & time: 08/08/19  1948     History Chief Complaint  Patient presents with  . Dizziness    Ian Baker is a 54 y.o. male.  HPI     This a 54 year old male with a history of obesity, tobacco use who presents with dizziness.  Patient reports overall he is fairly healthy.  He has not seen a primary physician in some time because of Covid but [facility].  He reports a normal A1c.  He states that he for "some time" has had episodes of dizziness.  They usually come after eating.  The last 2 episodes of dizziness came after eating high carbohydrate meals including pizza and rice.  He reports room spinning and lightheadedness.  Symptoms resolved spontaneously.  He does also occasionally have an associated headache.  He has a family history of diabetes and was concerned that he may have high blood sugar.  He is not noted any nausea, vomiting, diarrhea, abdominal pain.  No chest pain or shortness of breath.  No recent illnesses or fevers.  Currently he is symptom-free.  He is no longer dizzy.  Denies any vision changes, weakness, numbness, strokelike symptoms.  Past Medical History:  Diagnosis Date  . Arthritis   . Chest pain    Normal coronary arteries 07/14/11 with normal LV function by echo at that time, negative for PE by CT angio 07/13/11  . Hepatic steatosis   . Hyperglycemia    A1C 5.8  . Marijuana abuse    smokes about 1x/month  . Overweight(278.02)   . Thrombocytopenia (HCC)    Noted 1/13 hospitalization - to f/u with PCP  . Tobacco abuse    1 thin cigar every few months    Patient Active Problem List   Diagnosis Date Noted  . Chest pain   . Hyperglycemia   . Thrombocytopenia (HCC)   . Marijuana abuse   . Tobacco abuse     Past Surgical History:  Procedure Laterality Date  . APPENDECTOMY     20005  . LEFT HEART  CATHETERIZATION WITH CORONARY ANGIOGRAM N/A 07/14/2011   Procedure: LEFT HEART CATHETERIZATION WITH CORONARY ANGIOGRAM;  Surgeon: Herby Abraham, MD;  Location: [facility] C[facility] LAB;  Service: Cardiovascular;  Laterality: N/A;       Family History  Problem Relation Age of Onset  . Diabetes Mother   . Coronary artery disease Mother        onset 39's, currently 62    Social History   Tobacco Use  . Smoking status: Current Some Day Smoker    Types: Cigars  . Smokeless tobacco: Current User  . Tobacco comment: smokes 1 cigar every month  Substance Use Topics  . Alcohol use: Yes    Alcohol/week: 2.0 standard drinks    Types: 2 Cans of beer per week    Comment: has a few drinks/month  . Drug use: Yes    Types: Marijuana    Comment: uses marijuana about 1x/month    Home Medications Prior to Admission medications   Medication Sig Start Date End Date Taking? Authorizing Provider  aspirin EC 81 MG tablet Take 81-162 mg by mouth every 4 (four) hours as needed for mild pain.    [provider]  fluticasone (FLONASE) 50 MCG/ACT nasal spray Place 2 sprays into both nostrils daily. 11/19/15   Hedges, Tinnie Gens, PA-C  ibuprofen (ADVIL,MOTRIN)  200 MG tablet Take 200 mg by mouth every 6 (six) hours as needed for moderate pain. For pain    [provider]  loratadine (CLARITIN) 10 MG tablet Take 1 tablet (10 mg total) by mouth daily. 11/19/15   Hedges, Tinnie Gens, PA-C  omeprazole (PRILOSEC) 20 MG capsule Take 1 capsule (20 mg total) by mouth daily. 11/19/15   Eyvonne Mechanic, PA-C    Allergies    Patient has no known allergies.  Review of Systems   Review of Systems  Constitutional: Negative for fever.  Respiratory: Negative for shortness of breath.   Cardiovascular: Negative for chest pain.  Gastrointestinal: Negative for abdominal pain, nausea and vomiting.  Genitourinary: Negative for dysuria.  Neurological: Positive for dizziness and headaches. Negative for weakness and  numbness.  All other systems reviewed and are negative.   Physical Exam Updated Vital Signs BP 117/85 (BP Location: Right Arm)   Pulse (!) 55   Temp 97.8 F (36.6 C) (Oral)   Resp 18   SpO2 100%   Physical Exam Vitals and nursing note reviewed.  Constitutional:      Appearance: He is well-developed. He is not ill-appearing.  HENT:     Head: Normocephalic and atraumatic.     Mouth/Throat:     Mouth: Mucous membranes are moist.  Eyes:     Pupils: Pupils are equal, round, and reactive to light.     Comments: Extraocular movements intact, no nystagmus noted  Cardiovascular:     Rate and Rhythm: Normal rate and regular rhythm.     Heart sounds: Normal heart sounds. No murmur.  Pulmonary:     Effort: Pulmonary effort is normal. No respiratory distress.     Breath sounds: Normal breath sounds. No wheezing.  Abdominal:     General: Bowel sounds are normal.     Palpations: Abdomen is soft.     Tenderness: There is no abdominal tenderness. There is no rebound.  Musculoskeletal:     Cervical back: Neck supple.     Right lower leg: No edema.     Left lower leg: No edema.  Lymphadenopathy:     Cervical: No cervical adenopathy.  Skin:    General: Skin is warm and dry.  Neurological:     Mental Status: He is alert and oriented to person, place, and time.     Comments: [employer], cranial nerves II through XII intact, 5 out of 5 strength in all 4 extremities, no dysmetria to finger-nose-finger  Psychiatric:        Mood and Affect: Mood normal.     ED Results / Procedures / Treatments   Labs (all labs ordered are listed, but only abnormal results are displayed) Labs Reviewed  BASIC METABOLIC PANEL - Abnormal; Notable for the following components:      Result Value   Glucose, Bld 101 (*)    Calcium 8.8 (*)    All other components within normal limits  CBC  URINALYSIS, ROUTINE W REFLEX MICROSCOPIC  CBG MONITORING, ED    EKG EKG  Interpretation  Date/Time:  Monday August 08 2019 20:13:41 EST Ventricular Rate:  63 PR Interval:  152 QRS Duration: 86 QT Interval:  392 QTC Calculation: 401 R Axis:   -95 Text Interpretation: Normal sinus rhythm Right superior axis deviation Right ventricular hypertrophy Possible Anterior infarct , age undetermined Abnormal ECG No significant change since last tracing Confirmed by Ross Marcus ([id number]) on 08/09/2019 3:47:07 AM   Radiology No results found.  Procedures  Procedures (including critical care time)  Medications Ordered in ED Medications  sodium chloride flush (NS) 0.9 % injection 3 mL (has no administration in time range)    ED Course  I have reviewed the triage vital signs and the nursing notes.  Pertinent labs & imaging results that were available during my care of the patient were reviewed by me and considered in my medical decision making (see chart for details).    MDM Rules/Calculators/A&P                       Patient presents with intermittent dizziness.  Relates it to eating high carbohydrate and spicy meals.  He is currently symptom-free.  Vital signs reviewed and normal.  Lab work reviewed and largely unremarkable.  Glucose is 101.  EKG shows no evidence of ischemia or arrhythmia.  Patient's neurologic exam is normal without evidence of cerebellar dysfunction.  Given the association with eating high carbohydrate meals, I suspect he may be having some transient hypotension postprandially.  I discussed this with the patient.  Recommend avoiding high carbohydrate meals.  After history, exam, and medical workup I feel the patient has been appropriately medically screened and is safe for discharge home. Pertinent diagnoses were discussed with the patient. Patient was given return precautions.   Final Clinical Impression(s) / ED Diagnoses Final diagnoses:  Dizziness    Rx / DC Orders ED Discharge Orders    None       Pelagia Iacobucci, Barbette Hair,  MD 08/09/19 [phone]

## 2019-08-09 NOTE — ED Notes (Signed)
Pt verbalized understanding of d/c instructions and follow up. Pt ambulatory with steady gait to WR.

## 2020-02-07 ENCOUNTER — Ambulatory Visit: Payer: Self-pay | Attending: Internal Medicine | Admitting: Internal Medicine

## 2020-02-07 ENCOUNTER — Ambulatory Visit (HOSPITAL_BASED_OUTPATIENT_CLINIC_OR_DEPARTMENT_OTHER): Payer: Self-pay | Admitting: Pharmacist

## 2020-02-07 ENCOUNTER — Other Ambulatory Visit: Payer: Self-pay

## 2020-02-07 ENCOUNTER — Encounter: Payer: Self-pay | Admitting: Internal Medicine

## 2020-02-07 VITALS — BP 121/79 | HR 76 | Temp 98.6°F | Resp 16 | Ht 72.0 in | Wt 255.4 lb

## 2020-02-07 DIAGNOSIS — Z0000 Encounter for general adult medical examination without abnormal findings: Secondary | ICD-10-CM

## 2020-02-07 DIAGNOSIS — N401 Enlarged prostate with lower urinary tract symptoms: Secondary | ICD-10-CM

## 2020-02-07 DIAGNOSIS — Z23 Encounter for immunization: Secondary | ICD-10-CM

## 2020-02-07 DIAGNOSIS — Z1159 Encounter for screening for other viral diseases: Secondary | ICD-10-CM

## 2020-02-07 DIAGNOSIS — E6609 Other obesity due to excess calories: Secondary | ICD-10-CM | POA: Insufficient documentation

## 2020-02-07 DIAGNOSIS — R350 Frequency of micturition: Secondary | ICD-10-CM | POA: Insufficient documentation

## 2020-02-07 DIAGNOSIS — Z1211 Encounter for screening for malignant neoplasm of colon: Secondary | ICD-10-CM

## 2020-02-07 DIAGNOSIS — Z114 Encounter for screening for human immunodeficiency virus [HIV]: Secondary | ICD-10-CM

## 2020-02-07 DIAGNOSIS — Z86010 Personal history of colonic polyps: Secondary | ICD-10-CM

## 2020-02-07 DIAGNOSIS — Z6834 Body mass index (BMI) 34.0-34.9, adult: Secondary | ICD-10-CM

## 2020-02-07 MED ORDER — TAMSULOSIN HCL 0.4 MG PO CAPS: 0.4000 mg | ORAL_CAPSULE | Freq: Every day | ORAL | 3 refills | Status: DC

## 2020-02-07 NOTE — Progress Notes (Signed)
Patient ID: Ian Baker, male    DOB: [birth year]  MRN: [id number]  CC: New Patient (Initial Visit)   Subjective: Ian Baker is a 54 y.o. male who presents for new patient visit and general check up/physical. His concerns today include:  Patient with history of  obesity  Wants prostate exam.   Reports nocturia 2-3 x a nights for 2 yrs.  He tries not to drink too much water at nights. 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  Last cup at 9 p.m.  In bed around 11:30 p.m -has problem initiating urine flow and stream is weak. Does not feel like he completely empties.  + stop and go urine flow.  -no hematuria. Maternal uncle has prostate CA  Wants c-scope.  Had colonoscopy about 7-10 yrs.  Had several polyps removed.  Told to have repeat in 5 yrs and he is due. No fhx of colon CA. currently he is uninsured.  Obesity:  Over all he feels he eats good 75% of the time.  Occasionally "pig out on pizza and chips. I do have a sweet tooth." -does a lot of walking as a Engineer, materials.  Does some push-ups and sit-ups 3 x a wk.  Lifts 25 lbs wgh.  Bikes 10 miles a wk  HM:  Due for Hep C and HIV. Had J&J April 12th 2021.  Wants to know if he needs booster shot. Due for Tdap.  Last was 15 yrs.  Past medical, surgical, family history and social history reviewed and updated in the system. Patient Active Problem List   Diagnosis Date Noted  . Chest pain   . Hyperglycemia   . Thrombocytopenia (HCC)   . Marijuana abuse   . Tobacco abuse      Current Outpatient Medications on File Prior to Visit  Medication Sig Dispense Refill  . aspirin EC 81 MG tablet Take 81-162 mg by mouth every 4 (four) hours as needed for mild pain.    . fluticasone (FLONASE) 50 MCG/ACT nasal spray Place 2 sprays into both nostrils daily. (Patient not taking: Reported on 02/07/2020) 9.9 g 2  . ibuprofen (ADVIL,MOTRIN) 200 MG tablet Take 200 mg by mouth every 6 (six) hours as  needed for moderate pain. For pain (Patient not taking: Reported on 02/07/2020)    . loratadine (CLARITIN) 10 MG tablet Take 1 tablet (10 mg total) by mouth daily. (Patient not taking: Reported on 02/07/2020) 30 tablet 2  . omeprazole (PRILOSEC) 20 MG capsule Take 1 capsule (20 mg total) by mouth daily. (Patient not taking: Reported on 02/07/2020) 30 capsule 0   No current facility-administered medications on file prior to visit.    No Known Allergies  Social History   Socioeconomic History  . Marital status: Single    Spouse name: Not on file  . Number of children: Not on file  . Years of education: Not on file  . Highest education level: Not on file  Occupational History  . Occupation: Cab Air traffic controller: [employer]  Tobacco Use  . Smoking status: Current Some Day Smoker    Types: Cigars  . Smokeless tobacco: Current User  . Tobacco comment: smokes 1 cigar every month  Substance and Sexual Activity  . Alcohol use: Yes    Alcohol/week: 2.0 standard drinks    Types: 2 Cans of beer per week    Comment: has a few drinks/month  . Drug  use: Yes    Types: Marijuana    Comment: uses marijuana about 1x/month  . Sexual activity: Yes  Other Topics Concern  . Not on file  Social History Narrative   Lives by himself in [place].  Works as a Scientist, physiological.  Walks regularly.  Frequently does push ups.   Social Determinants of Health   Financial Resource Strain:   . Difficulty of Paying Living Expenses:   Food Insecurity:   . Worried About Programme researcher, broadcasting/film/video in the Last Year:   . Barista in the Last Year:   Transportation Needs:   . Freight forwarder (Medical):   Marland Kitchen Lack of Transportation (Non-Medical):   Physical Activity:   . Days of Exercise per Week:   . Minutes of Exercise per Session:   Stress:   . Feeling of Stress :   Social Connections:   . Frequency of Communication with Friends and Family:   . Frequency of Social Gatherings with Friends and Family:   .  Attends Religious Services:   . Active Member of Clubs or Organizations:   . Attends Banker Meetings:   Marland Kitchen Marital Status:   Intimate Partner Violence:   . Fear of Current or Ex-Partner:   . Emotionally Abused:   Marland Kitchen Physically Abused:   . Sexually Abused:     Family History  Problem Relation Age of Onset  . Diabetes Mother   . Coronary artery disease Mother        onset 49's, currently 28    Past Surgical History:  Procedure Laterality Date  . APPENDECTOMY     20005  . LEFT HEART CATHETERIZATION WITH CORONARY ANGIOGRAM N/A 07/14/2011   Procedure: LEFT HEART CATHETERIZATION WITH CORONARY ANGIOGRAM;  Surgeon: Herby Abraham, MD;  Location: [facility] CATH LAB;  Service: Cardiovascular;  Laterality: N/A;    ROS: Review of Systems  HENT: Negative for hearing loss.   Eyes:       Has problems reading things close up. Had vision and hearing screening about 3 years ago.  Respiratory: Negative for chest tightness and shortness of breath.   Cardiovascular: Negative for chest pain and leg swelling.  Gastrointestinal: Negative for blood in stool.       No problems passing bowels.  Psychiatric/Behavioral: Negative for dysphoric mood.   PHYSICAL EXAM: BP 121/79   Pulse 76   Temp 98.6 F (37 C)   Resp 16   Ht 6' (1.829 m)   Wt 255 lb 6.4 oz (115.8 kg)   SpO2 98%   BMI 34.64 kg/m   Wt Readings from Last 3 Encounters:  02/07/20 255 lb 6.4 oz (115.8 kg)  11/19/15 265 lb (120.2 kg)  07/26/15 270 lb (122.5 kg)   Physical Exam General appearance - alert, well appearing, middle-age African-American male and in no distress Mental status - normal mood, behavior, speech, dress, motor activity, and thought processes Eyes - pupils equal and reactive, extraocular eye movements intact Ears - bilateral TM's and external ear canals normal Nose - normal and patent, no erythema, discharge or polyps Mouth - mucous membranes moist, pharynx normal without lesions Neck - supple, no  significant adenopathy Lymphatics - no palpable lymphadenopathy, no hepatosplenomegaly Chest - clear to auscultation, no wheezes, rales or rhonchi, symmetric air entry Heart - normal rate, regular rhythm, normal S1, S2, no murmurs, rubs, clicks or gallops Abdomen - soft, nontender, nondistended, no masses or organomegaly Rectal exam: CMA Farrington present -no external hemorrhoids noted.  My finger was not long enough to palpate the prostate. Neurological -nonfocal Extremities - peripheral pulses normal, no pedal edema, no clubbing or cyanosis  CMP Latest Ref Rng & Units 08/08/2019 07/26/2015 07/14/2011  Glucose 70 - 99 mg/dL 081(K) 481(E) 563(J)  BUN 6 - 20 mg/dL 8 9 8   Creatinine 0.61 - 1.24 mg/dL 4.97 0.26  Sodium 135 - 145 mmol/L 140 140 141  Potassium 3.5 - 5.1 mmol/L 3.6 4.0 3.9  Chloride 98 - 111 mmol/L 110 106 105  CO2 22 - 32 mmol/L 22 24 29   Calcium 8.9 - 10.3 mg/dL 3.78) 9.2 9.1  Total Protein 6.0 - 8.3 g/dL - - 7.0  Total Bilirubin 0.3 - 1.2 mg/dL - - 0.5  Alkaline Phos 39 - 117 U/L - - 37(L)  AST 0 - 37 U/L - - 27  ALT 0 - 53 U/L - - 39   Lipid Panel     Component Value Date/Time   CHOL 172 07/14/2011 0503   TRIG 109 07/14/2011 0503   HDL 48 07/14/2011 0503   CHOLHDL 3.6 07/14/2011 0503   VLDL 22 07/14/2011 0503   LDLCALC 102 (H) 07/14/2011 0503    CBC    Component Value Date/Time   WBC 5.5 08/08/2019 2013   RBC 5.12 08/08/2019 2013   HGB 15.4 08/08/2019 2013   HCT 44.6 08/08/2019 2013   PLT 159 08/08/2019 2013   MCV 87.1 08/08/2019 2013   MCH 30.1 08/08/2019 2013   MCHC 34.5 08/08/2019 2013   RDW 12.6 08/08/2019 2013   LYMPHSABS 3.1 07/13/2011 0940   MONOABS 0.5 07/13/2011 0940   EOSABS 0.1 07/13/2011 0940   BASOSABS 0.0 07/13/2011 0940    ASSESSMENT AND PLAN: 1. Annual physical exam  2. Benign prostatic hyperplasia with urinary frequency Advised patient that even though I was not able to palpate his prostate, based on symptoms he has prostate  enlargement. I recommend not drinking caffeinated beverages within 4 to 5 hours of bedtime as caffeine is an irritant to the bladder. He should also avoid excessive alcohol intake in the evenings. I recommend trial of Flomax. Went over with him how the medication works. Patient is willing to give it a try. - PSA - tamsulosin (FLOMAX) 0.4 MG CAPS capsule; Take 1 capsule (0.4 mg total) by mouth daily.  Dispense: 30 capsule; Refill: 3  3. Class 1 obesity due to excess calories without serious comorbidity with body mass index (BMI) of 34.0 to 34.9 in adult Discussed and encourage healthy eating habits. Encouraged him to choose healthier snacks like fruits or nuts. Advised to eliminate sugary drinks like juices, soda and sweet tea. Eat more lean white meat than red meat and cut back on white carbohydrates. Commended him on exercise activities and encouraged him to continue doing so. - Lipid panel - Comprehensive metabolic panel - Hemoglobin A1c - CBC  4. Screening for colon cancer Patient to apply for the orange card/cone discount card. Once approved we will refer him for colonoscopy given his history of colon polyps.  5. Screening for HIV (human immunodeficiency virus) Patient agreeable to screening - HIV Antibody (routine testing w rflx)  6. Need for hepatitis C screening test Patient agreeable to screening - Hepatitis C Antibody  7. History of colon polyps See #4 above  8. Need for Tdap vaccination Given  Follow-up in 6 weeks to see how he is doing on the Flomax. If he has the orange card at that time we will refer to  gastroenterology. Patient was given the opportunity to ask questions.  Patient verbalized understanding of the plan and was able to repeat key elements of the plan.   No orders of the defined types were placed in this encounter.    Requested Prescriptions    No prescriptions requested or ordered in this encounter    No follow-ups on file.  Ian Blueeborah Tanav Orsak, MD,  FACP

## 2020-02-07 NOTE — Progress Notes (Signed)
Patient presents for vaccination against tetanus per orders of Dr. Laural Benes. Consent given. Counseling provided. No contraindications exists. Vaccine administered without incident. Will coordinate with pharmacy for patient assistance.   Butch Penny, PharmD, CPP Clinical Pharmacist [facility] [phone]

## 2020-02-07 NOTE — Patient Instructions (Addendum)
Based on your symptoms, you have an enlarged prostate.  I have started you on a medication called Flomax that you will take once a day at bedtime.  Please apply for the orange card/cone discount card.  Once approved, please let me know so that we can refer you for your colonoscopy.   Obesity, Adult Obesity is having too much body fat. Being obese means that your weight is more than what is healthy for you. BMI is a number that explains how much body fat you have. If you have a BMI of 30 or more, you are obese. Obesity is often caused by eating or drinking more calories than your body uses. Changing your lifestyle can help you lose weight. Obesity can cause serious health problems, such as:  Stroke.  Coronary artery disease (CAD).  Type 2 diabetes.  Some types of cancer, including cancers of the colon, breast, uterus, and gallbladder.  Osteoarthritis.  High blood pressure (hypertension).  High cholesterol.  Sleep apnea.  Gallbladder stones.  Infertility problems. What are the causes?  Eating meals each day that are high in calories, sugar, and fat.  Being born with genes that may make you more likely to become obese.  Having a medical condition that causes obesity.  Taking certain medicines.  Sitting a lot (having a sedentary lifestyle).  Not getting enough sleep.  Drinking a lot of drinks that have sugar in them. What increases the risk?  Having a family history of obesity.  Being an [place] American woman.  Being a Hispanic man.  Living in an area with limited access to: ? [place] [facility], recreation centers, or sidewalks. ? Healthy food choices, such as grocery stores and farmers' markets. What are the signs or symptoms? The main sign is having too much body fat. How is this treated?  Treatment for this condition often includes changing your lifestyle. Treatment may include: ? Changing your diet. This may include making a healthy meal plan. ? Exercise. This may  include activity that causes your heart to beat faster (aerobic exercise) and strength training. Work with your doctor to design a program that works for you. ? Medicine to help you lose weight. This may be used if you are not able to lose 1 pound a week after 6 weeks of healthy eating and more exercise. ? Treating conditions that cause the obesity. ? Surgery. Options may include gastric banding and gastric bypass. This may be done if:  Other treatments have not helped to improve your condition.  You have a BMI of 40 or higher.  You have life-threatening health problems related to obesity. Follow these instructions at home: Eating and drinking   Follow advice from your doctor about what to eat and drink. Your doctor may tell you to: ? Limit fast food, sweets, and processed snack foods. ? Choose low-fat options. For example, choose low-fat milk instead of whole milk. ? Eat 5 or more servings of fruits or vegetables each day. ? Eat at home more often. This gives you more control over what you eat. ? Choose healthy foods when you eat out. ? Learn to read food labels. This will help you learn how much food is in 1 serving. ? Keep low-fat snacks available. ? Avoid drinks that have a lot of sugar in them. These include soda, fruit juice, iced tea with sugar, and flavored milk.  Drink enough water to keep your pee (urine) pale yellow.  Do not go on fad diets. Physical activity  Exercise often,  as told by your doctor. Most adults should get up to 150 minutes of moderate-intensity exercise every week.Ask your doctor: ? What types of exercise are safe for you. ? How often you should exercise.  Warm up and stretch before being active.  Do slow stretching after being active (cool down).  Rest between times of being active. Lifestyle  Work with your doctor and a food expert (dietitian) to set a weight-loss goal that is best for you.  Limit your screen time.  Find ways to reward  yourself that do not involve food.  Do not drink alcohol if: ? Your doctor tells you not to drink. ? You are pregnant, may be pregnant, or are planning to become pregnant.  If you drink alcohol: ? Limit how much you use to:  0-1 drink a day for women.  0-2 drinks a day for men. ? Be aware of how much alcohol is in your drink. In the [place] one drink equals one 12 oz bottle of beer (355 mL), one 5 oz glass of wine (148 mL), or one 1 oz glass of hard liquor (44 mL). General instructions  Keep a weight-loss journal. This can help you keep track of: ? The food that you eat. ? How much exercise you get.  Take over-the-counter and prescription medicines only as told by your doctor.  Take vitamins and supplements only as told by your doctor.  Think about joining a support group.  Keep all follow-up visits as told by your doctor. This is important. Contact a doctor if:  You cannot meet your weight loss goal after you have changed your diet and lifestyle for 6 weeks. Get help right away if you:  Are having trouble breathing.  Are having thoughts of harming yourself. Summary  Obesity is having too much body fat.  Being obese means that your weight is more than what is healthy for you.  Work with your doctor to set a weight-loss goal.  Get regular exercise as told by your doctor. This information is not intended to replace advice given to you by your health care provider. Make sure you discuss any questions you have with your health care provider. Document Revised: 02/11/2018 Document Reviewed: 02/11/2018 Elsevier Patient Education  2020 Elsevier Inc.   Td (Tetanus, Diphtheria) Vaccine: What You Need to Know 1. Why get vaccinated? Td vaccine can prevent tetanus and diphtheria. Tetanus enters the body through cuts or wounds. Diphtheria spreads from person to person.  TETANUS (T) causes painful stiffening of the muscles. Tetanus can lead to serious health problems, including  being unable to open the mouth, having trouble swallowing and breathing, or death.  DIPHTHERIA (D) can lead to difficulty breathing, heart failure, paralysis, or death. 2. Td vaccine Td is only for children 7 years and older, adolescents, and adults.  Td is usually given as a booster dose every 10 years, but it can also be given earlier after a severe and dirty wound or burn. Another vaccine, called Tdap, that protects against pertussis, also known as "whooping cough," in addition to tetanus and diphtheria, may be used instead of Td.  Td may be given at the same time as other vaccines. 3. Talk with your health care provider Tell your vaccine provider if the person getting the vaccine:  Has had an allergic reaction after a previous dose of any vaccine that protects against tetanus or diphtheria, or has any severe, life-threatening allergies.  Has ever had Guillain-Barr Syndrome (also called GBS).  Has  had severe pain or swelling after a previous dose of any vaccine that protects against tetanus or diphtheria. In some cases, your health care provider may decide to postpone Td vaccination to a future visit.  People with minor illnesses, such as a cold, may be vaccinated. People who are moderately or severely ill should usually wait until they recover before getting Td vaccine.  Your health care provider can give you more information. 4. Risks of a vaccine reaction  Pain, redness, or swelling where the shot was given, mild fever, headache, feeling tired, and nausea, vomiting, diarrhea, or stomachache sometimes happen after Td vaccine. People sometimes faint after medical procedures, including vaccination. Tell your provider if you feel dizzy or have vision changes or ringing in the ears.  As with any medicine, there is a very remote chance of a vaccine causing a severe allergic reaction, other serious injury, or death. 5. What if there is a serious problem? An allergic reaction could occur  after the vaccinated person leaves the clinic. If you see signs of a severe allergic reaction (hives, swelling of the face and throat, difficulty breathing, a fast heartbeat, dizziness, or weakness), call 9-1-1 and get the person to the nearest hospital.  For other signs that concern you, call your health care provider.  Adverse reactions should be reported to the Vaccine Adverse Event Reporting System (VAERS). Your health care provider will usually file this report, or you can do it yourself. Visit the VAERS website at www.vaers.LAgents.no or call 519-084-0669. VAERS is only for reporting reactions, and VAERS staff do not give medical advice. 6. The National Vaccine Injury Compensation Program The [employer] Vaccine Injury Compensation Program (VICP) is a federal program that was created to compensate people who may have been injured by certain vaccines. Visit the VICP website at SpiritualWord.at or call 281-598-6702 to learn about the program and about filing a claim. There is a time limit to file a claim for compensation. 7. How can I learn more?  Ask your health care provider.  Call your local or state health department.  Contact the Centers for Disease Control and Prevention (CDC): ? Call 325 374 7430 (1-800-CDC-INFO) or ? Visit CDC's website at PicCapture.uy Vaccine Information Statement Td Vaccine (09/22/18) This information is not intended to replace advice given to you by your health care provider. Make sure you discuss any questions you have with your health care provider. Document Revised: 11/01/2018 Document Reviewed: 10/04/2018 Elsevier Patient Education  2020 [employer].

## 2020-02-08 ENCOUNTER — Telehealth: Payer: Self-pay

## 2020-02-08 ENCOUNTER — Encounter: Payer: Self-pay | Admitting: Internal Medicine

## 2020-02-08 DIAGNOSIS — E785 Hyperlipidemia, unspecified: Secondary | ICD-10-CM | POA: Insufficient documentation

## 2020-02-08 DIAGNOSIS — R7303 Prediabetes: Secondary | ICD-10-CM | POA: Insufficient documentation

## 2020-02-08 LAB — COMPREHENSIVE METABOLIC PANEL
ALT: 20 IU/L (ref 0–44)
AST: 18 IU/L (ref 0–40)
Albumin/Globulin Ratio: 1.8 (ref 1.2–2.2)
Albumin: 4.5 g/dL (ref 3.8–4.9)
Alkaline Phosphatase: 47 IU/L — ABNORMAL LOW (ref 48–121)
BUN/Creatinine Ratio: 14 (ref 9–20)
BUN: 15 mg/dL (ref 6–24)
Bilirubin Total: 0.6 mg/dL (ref 0.0–1.2)
CO2: 24 mmol/L (ref 20–29)
Calcium: 9.1 mg/dL (ref 8.7–10.2)
Chloride: 105 mmol/L (ref 96–106)
Creatinine, Ser: 1.09 mg/dL (ref 0.76–1.27)
GFR calc Af Amer: 88 mL/min/{1.73_m2} (ref >59)
GFR calc non Af Amer: 77 mL/min/{1.73_m2} (ref >59)
Globulin, Total: 2.5 g/dL (ref 1.5–4.5)
Glucose: 97 mg/dL (ref 65–99)
Potassium: 4.2 mmol/L (ref 3.5–5.2)
Sodium: 142 mmol/L (ref 134–144)
Total Protein: 7 g/dL (ref 6.0–8.5)

## 2020-02-08 LAB — CBC
Hematocrit: 46.1 % (ref 37.5–51.0)
Hemoglobin: 14.9 g/dL (ref 13.0–17.7)
MCH: 28 pg (ref 26.6–33.0)
MCHC: 32.3 g/dL (ref 31.5–35.7)
MCV: 87 fL (ref 79–97)
Platelets: 170 10*3/uL (ref 150–450)
RBC: 5.32 x10E6/uL (ref 4.14–5.80)
RDW: 13.2 % (ref 11.6–15.4)
WBC: 5 10*3/uL (ref 3.4–10.8)

## 2020-02-08 LAB — HEMOGLOBIN A1C
Est. average glucose Bld gHb Est-mCnc: 120 mg/dL
Hgb A1c MFr Bld: 5.8 % — ABNORMAL HIGH (ref 4.8–5.6)

## 2020-02-08 LAB — LIPID PANEL
Chol/HDL Ratio: 3.6 ratio (ref 0.0–5.0)
Cholesterol, Total: 170 mg/dL (ref 100–199)
HDL: 47 mg/dL (ref >39)
LDL Chol Calc (NIH): 108 mg/dL — ABNORMAL HIGH (ref 0–99)
Triglycerides: 77 mg/dL (ref 0–149)
VLDL Cholesterol Cal: 15 mg/dL (ref 5–40)

## 2020-02-08 LAB — HIV ANTIBODY (ROUTINE TESTING W REFLEX): HIV Screen 4th Generation wRfx: NONREACTIVE

## 2020-02-08 LAB — PSA: Prostate Specific Ag, Serum: 1.6 ng/mL (ref 0.0–4.0)

## 2020-02-08 LAB — HEPATITIS C ANTIBODY: Hep C Virus Ab: 0.2 s/co ratio (ref 0.0–0.9)

## 2020-02-08 NOTE — Progress Notes (Signed)
Let patient know that his LDL cholesterol is 108 with goal being less than 100.  This is the bad cholesterol that can increase risk for heart attack and strokes.  Healthy eating habits and regular exercise as discussed yesterday will help to lower cholesterol. Kidney and liver function tests normal. He has prediabetes.  Healthy eating habits and regular exercise will help prevent progression to full diabetes. PSA which is the screening test for prostate cancer was normal. HIV test was negative.  Screening test for hepatitis C was negative.  Blood count was normal.

## 2020-02-08 NOTE — Telephone Encounter (Signed)
Contacted pt to go over lab results pt is aware and doesn't have any questions or concerns 

## 2020-03-30 ENCOUNTER — Other Ambulatory Visit: Payer: Self-pay

## 2020-03-30 ENCOUNTER — Ambulatory Visit: Payer: Self-pay | Attending: Internal Medicine | Admitting: Internal Medicine

## 2020-03-30 DIAGNOSIS — Z86010 Personal history of colonic polyps: Secondary | ICD-10-CM

## 2020-03-30 DIAGNOSIS — R350 Frequency of micturition: Secondary | ICD-10-CM

## 2020-03-30 DIAGNOSIS — N401 Enlarged prostate with lower urinary tract symptoms: Secondary | ICD-10-CM

## 2020-03-30 DIAGNOSIS — R7303 Prediabetes: Secondary | ICD-10-CM

## 2020-03-30 DIAGNOSIS — E6609 Other obesity due to excess calories: Secondary | ICD-10-CM

## 2020-03-30 DIAGNOSIS — Z6834 Body mass index (BMI) 34.0-34.9, adult: Secondary | ICD-10-CM

## 2020-03-30 NOTE — Progress Notes (Signed)
Virtual Visit via Telephone Note Due to current restrictions/limitations of in-office visits due to the COVID-19 pandemic, this scheduled clinical appointment was converted to a telehealth visit  I connected with Ian Baker on 03/30/20 at 9:03 a.m by telephone and verified that I am speaking with the correct person using two identifiers. I am in my office.  The patient is at home.  Only the patient, my CMA Julius Bowels and myself participated in this encounter.  I discussed the limitations, risks, security and privacy concerns of performing an evaluation and management service by telephone and the availability of in person appointments. I also discussed with the patient that there may be a patient responsible charge related to this service. The patient expressed understanding and agreed to proceed.   History of Present Illness: Patient with history of BPH, obesity, prediabetes, colon polyps.  Last seen 01/2020.  Purpose of today's visit is 6 weeks follow-up on his BPH symptoms.  On last visit, pt c/o symptoms suggestive of BPH.  Started on Flomax.  Reports he is very please with results with Flomax Urine stream more steady and not having to get up frequently at nights to urinate as before.  Wants to know what he can do diet wise to improve prostate health.  Obesity:  PreDM on labs. Does not eat red meat.  No fried foods. Eating more white meat and fish.  Avoids sugary drinks. Drinks more water.  Eating more green veggies. Thinks he has loss about 10 lbs since last visit  HM: did not pick up forms for the orange card/cone discount forms on last visit as advised so that he can apply and be approved at which point I can then refer him to gastroenterology for his colonoscopy.  He plans to get his flu shot at [facility].   Outpatient Encounter Medications as of 03/30/2020  Medication Sig  . aspirin EC 81 MG tablet Take 81-162 mg by mouth every 4 (four) hours as needed for mild pain.  . tamsulosin  (FLOMAX) 0.4 MG CAPS capsule Take 1 capsule (0.4 mg total) by mouth daily.   No facility-administered encounter medications on file as of 03/30/2020.      Observations/Objective: Results for orders placed or performed in visit on 02/07/20  Lipid panel  Result Value Ref Range   Cholesterol, Total 170 100 - 199 mg/dL   Triglycerides 77 0 - 149 mg/dL   HDL 47 >62 mg/dL   VLDL Cholesterol Cal 15 5 - 40 mg/dL   LDL Chol Calc (NIH) 563 (H) 0 - 99 mg/dL   Chol/HDL Ratio 3.6 0.0 - 5.0 ratio  Comprehensive metabolic panel  Result Value Ref Range   Glucose 97 65 - 99 mg/dL   BUN 15 6 - 24 mg/dL   Creatinine, Ser 8.93 0.76 - 1.27 mg/dL   GFR calc non Af Amer 77 >59 mL/min/1.73   GFR calc Af Amer 88 >59 mL/min/1.73   BUN/Creatinine Ratio 14 9 - 20   Sodium 142 134 - 144 mmol/L   Potassium 4.2 3.5 - 5.2 mmol/L   Chloride 105 96 - 106 mmol/L   CO2 24 20 - 29 mmol/L   Calcium 9.1 8.7 - 10.2 mg/dL   Total Protein 7.0 6.0 - 8.5 g/dL   Albumin 4.5 3.8 - 4.9 g/dL   Globulin, Total 2.5 1.5 - 4.5 g/dL   Albumin/Globulin Ratio 1.8 1.2 - 2.2   Bilirubin Total 0.6 0.0 - 1.2 mg/dL   Alkaline Phosphatase 47 (L) 48 - 121  IU/L   AST 18 0 - 40 IU/L   ALT 20 0 - 44 IU/L  Hemoglobin A1c  Result Value Ref Range   Hgb A1c MFr Bld 5.8 (H) 4.8 - 5.6 %   Est. average glucose Bld gHb Est-mCnc 120 mg/dL  HIV Antibody (routine testing w rflx)  Result Value Ref Range   HIV Screen 4th Generation wRfx Non Reactive Non Reactive  Hepatitis C Antibody  Result Value Ref Range   Hep C Virus Ab 0.2 0.0 - 0.9 s/co ratio  PSA  Result Value Ref Range   Prostate Specific Ag, Serum 1.6 0.0 - 4.0 ng/mL  CBC  Result Value Ref Range   WBC 5.0 3.4 - 10.8 x10E3/uL   RBC 5.32 4.14 - 5.80 x10E6/uL   Hemoglobin 14.9 13.0 - 17.7 g/dL   Hematocrit 54.6 56.8 - 51.0 %   MCV 87 79 - 97 fL   MCH 28.0 26.6 - 33.0 pg   MCHC 32.3 31 - 35 g/dL   RDW 12.7 51.7 - 00.1 %   Platelets 170 150 - 450 x10E3/uL     Assessment and  Plan: 1. Benign prostatic hyperplasia with urinary frequency Significantly improved on Flomax.  He will continue the medication as prescribed.  2. Class 1 obesity due to excess calories without serious comorbidity with body mass index (BMI) of 34.0 to 34.9 in adult Commended him on changes in eating habits and trying to be active.  Commended him on weight loss.  Keep up the good works.  3. Prediabetes See #2 above  4. History of colon polyps Again I advised patient how to go about applying for the orange card/cone discount.  He states he will do so.  Once approved, I have asked him to contact me so that I can submit the referral for him to see the gastroenterologist.  I also requested that once he has received his flu vaccine at [facility] that he call and let us know so that I can update his health maintenance.   Follow Up Instructions: 4 mths   I discussed the assessment and treatment plan with the patient. The patient was provided an opportunity to ask questions and all were answered. The patient agreed with the plan and demonstrated an understanding of the instructions.   The patient was advised to call back or seek an in-person evaluation if the symptoms worsen or if the condition fails to improve as anticipated.  I provided 12 minutes of non-face-to-face time during this encounter.   Jonah Blue, MD

## 2020-07-31 ENCOUNTER — Ambulatory Visit: Payer: Self-pay | Admitting: Internal Medicine

## 2020-09-20 ENCOUNTER — Other Ambulatory Visit: Payer: Self-pay

## 2020-09-20 ENCOUNTER — Ambulatory Visit: Payer: Self-pay | Attending: Internal Medicine | Admitting: Internal Medicine

## 2020-09-20 VITALS — BP 108/70 | HR 74 | Ht 72.0 in | Wt 238.2 lb

## 2020-09-20 DIAGNOSIS — Z2821 Immunization not carried out because of patient refusal: Secondary | ICD-10-CM

## 2020-09-20 DIAGNOSIS — E669 Obesity, unspecified: Secondary | ICD-10-CM

## 2020-09-20 DIAGNOSIS — R350 Frequency of micturition: Secondary | ICD-10-CM

## 2020-09-20 DIAGNOSIS — N401 Enlarged prostate with lower urinary tract symptoms: Secondary | ICD-10-CM

## 2020-09-20 DIAGNOSIS — R7303 Prediabetes: Secondary | ICD-10-CM

## 2020-09-20 DIAGNOSIS — Z1211 Encounter for screening for malignant neoplasm of colon: Secondary | ICD-10-CM

## 2020-09-20 LAB — POCT GLYCOSYLATED HEMOGLOBIN (HGB A1C): HbA1c, POC (prediabetic range): 5.5 % — AB (ref 5.7–6.4)

## 2020-09-20 LAB — GLUCOSE, POCT (MANUAL RESULT ENTRY): POC Glucose: 100 mg/dl — AB (ref 70–99)

## 2020-09-20 MED ORDER — TAMSULOSIN HCL 0.4 MG PO CAPS: 0.4000 mg | ORAL_CAPSULE | Freq: Every day | ORAL | 6 refills | Status: DC

## 2020-09-20 NOTE — Progress Notes (Signed)
Patient ID: Ian Baker, male    DOB: [birth year]  MRN: [id number]  CC: Follow-up (4 month)   Subjective: Ian Baker is a 55 y.o. male who presents for chronic ds management His concerns today include:  Patient with history of BPH, obesity, prediabetes, colon polyps, HL.  BPH: Flomax works well for eliminating symptoms of BPH but he has been out of it for a few months.  He is needing refills.    He is inquiring today about referral for colonoscopy. On his last visit we had discussed getting him approved for the orange card/cone discount so that we can refer to GI.  He has not applied for it as yet.  Had c-scope >10 yrs ago and hd several polyps removed  HM: declines flu shot.  Had Pfizer 05/30/20 vaccine booster  PreDM:  Loss 17 lbs since 01/2020.  Patient reports weight loss is intentional through exercise and changes in eating habits.  Rides bike back and forth to work total of about 12 miles a wk.  Stopped eating sweet cereals and fast foods No blood in stools  Patient Active Problem List   Diagnosis Date Noted  . Prediabetes 02/08/2020  . Hyperlipidemia with target LDL less than 100 02/08/2020  . Benign prostatic hyperplasia with urinary frequency 02/07/2020  . Class 1 obesity due to excess calories without serious comorbidity with body mass index (BMI) of 34.0 to 34.9 in adult 02/07/2020  . History of colon polyps 02/07/2020  . Chest pain      Current Outpatient Medications on File Prior to Visit  Medication Sig Dispense Refill  . ibuprofen (ADVIL) 100 MG/5ML suspension Take 200 mg by mouth every 4 (four) hours as needed.    . tamsulosin (FLOMAX) 0.4 MG CAPS capsule Take 1 capsule (0.4 mg total) by mouth daily. 30 capsule 3  . aspirin EC 81 MG tablet Take 81-162 mg by mouth every 4 (four) hours as needed for mild pain. (Patient not taking: Reported on 09/20/2020)     No current facility-administered medications on file prior to visit.    No Known  Allergies  Social History   Socioeconomic History  . Marital status: Single    Spouse name: Not on file  . Number of children: 2  . Years of education: Not on file  . Highest education level: Associate degree: occupational, Scientist, product/process development, or vocational program  Occupational History  . Occupation: Cab Air traffic controller: [employer]  Tobacco Use  . Smoking status: Never Smoker  . Smokeless tobacco: Never Used  . Tobacco comment: smokes 1 cigar every month  Vaping Use  . Vaping Use: Never used  Substance and Sexual Activity  . Alcohol use: Yes    Alcohol/week: 2.0 standard drinks    Types: 2 Cans of beer per week    Comment: has a few drinks/month  . Drug use: Not Currently    Types: Marijuana    Comment: uses marijuana about 1x/month  . Sexual activity: Yes  Other Topics Concern  . Not on file  Social History Narrative  . Not on file   Social Determinants of Health   Financial Resource Strain: Not on file  Food Insecurity: Not on file  Transportation Needs: Not on file  Physical Activity: Not on file  Stress: Not on file  Social Connections: Not on file  Intimate Partner Violence: Not on file    Family History  Problem Relation Age of Onset  . Diabetes Mother   .  Coronary artery disease Mother        onset 50's, currently 70  . Prostate cancer Maternal Uncle     Past Surgical History:  Procedure Laterality Date  . APPENDECTOMY     20005  . LEFT HEART CATHETERIZATION WITH CORONARY ANGIOGRAM N/A 07/14/2011   Procedure: LEFT HEART CATHETERIZATION WITH CORONARY ANGIOGRAM;  Surgeon: Herby Abraham, MD;  Location: [facility] CATH LAB;  Service: Cardiovascular;  Laterality: N/A;    ROS: Review of Systems Negative except as stated above  PHYSICAL EXAM: BP 108/70 (BP Location: Left Arm, Patient Position: Sitting)   Pulse 74   Ht 6' (1.829 m)   Wt 238 lb 3.2 oz (108 kg)   SpO2 98%   BMI 32.31 kg/m   Wt Readings from Last 3 Encounters:  09/20/20 238 lb 3.2 oz (108  kg)  02/07/20 255 lb 6.4 oz (115.8 kg)  11/19/15 265 lb (120.2 kg)    Physical Exam  General appearance - alert, well appearing, middle-aged African-American male and in no distress Mental status - normal mood, behavior, speech, dress, motor activity, and thought processes Chest - clear to auscultation, no wheezes, rales or rhonchi, symmetric air entry Heart - normal rate, regular rhythm, normal S1, S2, no murmurs, rubs, clicks or gallops Extremities - peripheral pulses normal, no pedal edema, no clubbing or cyanosis  CMP Latest Ref Rng & Units 02/07/2020 08/08/2019 07/26/2015  Glucose 65 - 99 mg/dL 97 025(E) 527(P)  BUN 6 - 24 mg/dL 15 8 9   Creatinine 0.76 - 1.27 mg/dL 8.24 2.35  Sodium 134 - 144 mmol/L 142 140 140  Potassium 3.5 - 5.2 mmol/L 4.2 3.6 4.0  Chloride 96 - 106 mmol/L 105 110 106  CO2 20 - 29 mmol/L 24 22 24   Calcium 8.7 - 10.2 mg/dL 9.1 3.61) 9.2  Total Protein 6.0 - 8.5 g/dL 7.0 - -  Total Bilirubin 0.0 - 1.2 mg/dL 0.6 - -  Alkaline Phos 48 - 121 IU/L 47(L) - -  AST 0 - 40 IU/L 18 - -  ALT 0 - 44 IU/L 20 - -   Lipid Panel     Component Value Date/Time   CHOL 170 02/07/2020 0950   TRIG 77 02/07/2020 0950   HDL 47 02/07/2020 0950   CHOLHDL 3.6 02/07/2020 0950   CHOLHDL 3.6 07/14/2011 0503   VLDL 22 07/14/2011 0503   LDLCALC 108 (H) 02/07/2020 0950    CBC    Component Value Date/Time   WBC 5.0 02/07/2020 0950   WBC 5.5 08/08/2019 2013   RBC 5.32 02/07/2020 0950   RBC 5.12 08/08/2019 2013   HGB 14.9 02/07/2020 0950   HCT 46.1 02/07/2020 0950   PLT 170 02/07/2020 0950   MCV 87 02/07/2020 0950   MCH 28.0 02/07/2020 0950   MCH 30.1 08/08/2019 2013   MCHC 32.3 02/07/2020 0950   MCHC 34.5 08/08/2019 2013   RDW 13.2 02/07/2020 0950   LYMPHSABS 3.1 07/13/2011 0940   MONOABS 0.5 07/13/2011 0940   EOSABS 0.1 07/13/2011 0940   BASOSABS 0.0 07/13/2011 0940   Results for orders placed or performed in visit on 09/20/20  POCT glucose (manual entry)   Result Value Ref Range   POC Glucose 100 (A) 70 - 99 mg/dl  POCT glycosylated hemoglobin (Hb A1C)  Result Value Ref Range   Hemoglobin A1C     HbA1c POC (<> result, manual entry)     HbA1c, POC (prediabetic range) 5.5 (A) 5.7 - 6.4 %  HbA1c, POC (controlled diabetic range)       ASSESSMENT AND PLAN: 1. Benign prostatic hyperplasia with urinary frequency Doing well with Flomax but has been out of it.  Refill given - tamsulosin (FLOMAX) 0.4 MG CAPS capsule; Take 1 capsule (0.4 mg total) by mouth daily.  Dispense: 30 capsule; Refill: 6  2. Prediabetes Based on readings today he is no longer in the range for prediabetes.  I commended him on changes that he has made in his eating habits and on being active.  In doing so he has achieved weight loss and prevent progression to full diabetes.  Encouraged him to keep up the good works with healthy eating habits and regular exercise.  Printed information given on healthy eating habits. - POCT glucose (manual entry) - POCT glycosylated hemoglobin (Hb A1C)  3. Obesity (BMI 30.0-34.9) See #2 above.  4. Influenza vaccination declined Recommended.  Patient declined.  5. Screening for colon cancer Advised patient again to get the forms for the orange card/cone discount from the front desk today on checkout.  Once he has completed the process and is approved, he should let me know so that I can submit the referral to the gastroenterologist.   Patient was given the opportunity to ask questions.  Patient verbalized understanding of the plan and was able to repeat key elements of the plan.   No orders of the defined types were placed in this encounter.    Requested Prescriptions    No prescriptions requested or ordered in this encounter    No follow-ups on file.  Jonah Blue, MD, FACP

## 2020-09-20 NOTE — Progress Notes (Signed)
Wants to discuss possible prostate issues and having colonoscopy

## 2020-09-20 NOTE — Patient Instructions (Signed)
Healthy Eating Following a healthy eating pattern may help you to achieve and maintain a healthy body weight, reduce the risk of chronic disease, and live a long and productive life. It is important to follow a healthy eating pattern at an appropriate calorie level for your body. Your nutritional needs should be met primarily through food by choosing a variety of nutrient-rich foods. What are tips for following this plan? Reading food labels  Read labels and choose the following: ? Reduced or low sodium. ? Juices with 100% fruit juice. ? Foods with low saturated fats and high polyunsaturated and monounsaturated fats. ? Foods with whole grains, such as whole wheat, cracked wheat, brown rice, and wild rice. ? Whole grains that are fortified with folic acid. This is recommended for women who are pregnant or who want to become pregnant.  Read labels and avoid the following: ? Foods with a lot of added sugars. These include foods that contain brown sugar, corn sweetener, corn syrup, dextrose, fructose, glucose, high-fructose corn syrup, honey, invert sugar, lactose, malt syrup, maltose, molasses, raw sugar, sucrose, trehalose, or turbinado sugar.  Do not eat more than the following amounts of added sugar per day:  6 teaspoons (25 g) for women.  9 teaspoons (38 g) for men. ? Foods that contain processed or refined starches and grains. ? Refined grain products, such as white flour, degermed cornmeal, white bread, and white rice. Shopping  Choose nutrient-rich snacks, such as vegetables, whole fruits, and nuts. Avoid high-calorie and high-sugar snacks, such as potato chips, fruit snacks, and candy.  Use oil-based dressings and spreads on foods instead of solid fats such as butter, stick margarine, or cream cheese.  Limit pre-made sauces, mixes, and "instant" products such as flavored rice, instant noodles, and ready-made pasta.  Try more plant-protein sources, such as tofu, tempeh, black beans,  edamame, lentils, nuts, and seeds.  Explore eating plans such as the Mediterranean diet or vegetarian diet. Cooking  Use oil to saut or stir-fry foods instead of solid fats such as butter, stick margarine, or lard.  Try baking, boiling, grilling, or broiling instead of frying.  Remove the fatty part of meats before cooking.  Steam vegetables in water or broth. Meal planning  At meals, imagine dividing your plate into fourths: ? One-half of your plate is fruits and vegetables. ? One-fourth of your plate is whole grains. ? One-fourth of your plate is protein, especially lean meats, poultry, eggs, tofu, beans, or nuts.  Include low-fat dairy as part of your daily diet.   Lifestyle  Choose healthy options in all settings, including home, work, school, restaurants, or stores.  Prepare your food safely: ? Wash your hands after handling raw meats. ? Keep food preparation surfaces clean by regularly washing with hot, soapy water. ? Keep raw meats separate from ready-to-eat foods, such as fruits and vegetables. ? Cook seafood, meat, poultry, and eggs to the recommended internal temperature. ? Store foods at safe temperatures. In general:  Keep cold foods at 7F (4.4C) or below.  Keep hot foods at 17F (60C) or above.  Keep your freezer at [facility] (-17.8C) or below.  Foods are no longer safe to eat when they have been between the temperatures of 40-17F (4.4-60C) for more than 2 hours. What foods should I eat? Fruits Aim to eat 2 cup-equivalents of fresh, canned (in natural juice), or frozen fruits each day. Examples of 1 cup-equivalent of fruit include 1 small apple, 8 large strawberries, 1 cup canned fruit,  cup dried fruit, or 1 cup 100% juice. Vegetables Aim to eat 2-3 cup-equivalents of fresh and frozen vegetables each day, including different varieties and colors. Examples of 1 cup-equivalent of vegetables include 2 medium carrots, 2 cups raw, leafy greens, 1 cup chopped  vegetable (raw or cooked), or 1 medium baked potato. Grains Aim to eat 6 ounce-equivalents of whole grains each day. Examples of 1 ounce-equivalent of grains include 1 slice of bread, 1 cup ready-to-eat cereal, 3 cups popcorn, or  cup cooked rice, pasta, or cereal. Meats and other proteins Aim to eat 5-6 ounce-equivalents of protein each day. Examples of 1 ounce-equivalent of protein include 1 egg, 1/2 cup nuts or seeds, or 1 tablespoon (16 g) peanut butter. A cut of meat or fish that is the size of a deck of cards is about 3-4 ounce-equivalents.  Of the protein you eat each week, try to have at least 8 ounces come from seafood. This includes salmon, trout, herring, and anchovies. Dairy Aim to eat 3 cup-equivalents of fat-free or low-fat dairy each day. Examples of 1 cup-equivalent of dairy include 1 cup (240 mL) milk, 8 ounces (250 g) yogurt, 1 ounces (44 g) natural cheese, or 1 cup (240 mL) fortified soy milk. Fats and oils  Aim for about 5 teaspoons (21 g) per day. Choose monounsaturated fats, such as canola and olive oils, avocados, peanut butter, and most nuts, or polyunsaturated fats, such as sunflower, corn, and soybean oils, walnuts, pine nuts, sesame seeds, sunflower seeds, and flaxseed. Beverages  Aim for six 8-oz glasses of water per day. Limit coffee to three to five 8-oz cups per day.  Limit caffeinated beverages that have added calories, such as soda and energy drinks.  Limit alcohol intake to no more than 1 drink a day for nonpregnant women and 2 drinks a day for men. One drink equals 12 oz of beer (355 mL), 5 oz of wine (148 mL), or 1 oz of hard liquor (44 mL). Seasoning and other foods  Avoid adding excess amounts of salt to your foods. Try flavoring foods with herbs and spices instead of salt.  Avoid adding sugar to foods.  Try using oil-based dressings, sauces, and spreads instead of solid fats. This information is based on general [place] nutrition guidelines. For more  information, visit choosemyplate.gov. Exact amounts may vary based on your nutrition needs. Summary  A healthy eating plan may help you to maintain a healthy weight, reduce the risk of chronic diseases, and stay active throughout your life.  Plan your meals. Make sure you eat the right portions of a variety of nutrient-rich foods.  Try baking, boiling, grilling, or broiling instead of frying.  Choose healthy options in all settings, including home, work, school, restaurants, or stores. This information is not intended to replace advice given to you by your health care provider. Make sure you discuss any questions you have with your health care provider. Document Revised: 09/21/2017 Document Reviewed: 09/21/2017 Elsevier Patient Education  2021 [employer] Inc.  

## 2021-01-24 ENCOUNTER — Ambulatory Visit: Payer: Self-pay | Admitting: Internal Medicine

## 2021-03-28 ENCOUNTER — Ambulatory Visit: Payer: Self-pay | Admitting: Internal Medicine

## 2021-03-29 ENCOUNTER — Ambulatory Visit: Payer: Self-pay | Admitting: Internal Medicine

## 2021-05-03 ENCOUNTER — Ambulatory Visit: Payer: Self-pay | Admitting: Internal Medicine

## 2021-07-19 ENCOUNTER — Other Ambulatory Visit: Payer: Self-pay

## 2021-07-19 ENCOUNTER — Ambulatory Visit: Payer: Self-pay | Attending: Internal Medicine | Admitting: Internal Medicine

## 2021-07-19 DIAGNOSIS — N401 Enlarged prostate with lower urinary tract symptoms: Secondary | ICD-10-CM

## 2021-07-19 DIAGNOSIS — R7303 Prediabetes: Secondary | ICD-10-CM

## 2021-07-19 DIAGNOSIS — R350 Frequency of micturition: Secondary | ICD-10-CM

## 2021-07-19 DIAGNOSIS — Z125 Encounter for screening for malignant neoplasm of prostate: Secondary | ICD-10-CM

## 2021-07-19 MED ORDER — TAMSULOSIN HCL 0.4 MG PO CAPS: 0.4000 mg | ORAL_CAPSULE | Freq: Every day | ORAL | 6 refills | Status: DC

## 2021-07-19 NOTE — Progress Notes (Signed)
Patient ID: Ian Baker, male   DOB: 01/16/1966, 56 y.o.   MRN: [id number] Virtual Visit via Telephone Note  I connected with Ronni Rumble on 07/19/2021 at 4:12 PM by telephone and verified that I am speaking with the correct person using two identifiers  Location: Patient: home Provider: office  Participants: Myself Patient   I discussed the limitations, risks, security and privacy concerns of performing an evaluation and management service by telephone and the availability of in person appointments. I also discussed with the patient that there may be a patient responsible charge related to this service. The patient expressed understanding and agreed to proceed.   History of Present Illness: Patient with history of BPH, obesity, prediabetes, colon polyps, HL.Marland Kitchen  Last visit with me was 08/2020.  Today's visit is for chronic disease management.  Wanted PSA check because he urinates a lot. Consumes a lot water and coffee.  Drinks 2 cups of coffee a day.   Flomax helped but ran out a long time ago. When he was on it it decreased frequency significantly.  No Dysuria or hematuria.  Cousin recently passed from prostate cancer at age 52.Patient has not been in since March.  He states that he got 2 bills from last and wanted to know who he can speak to about it. On last visit, I encouraged him to apply for [facility] discount so we could refer to GI for colonoscopy.  Patient states he does not remember whether he did not qualify because of his income level but wants to talk more about it on our next in person visit.  He would like to have some baseline blood test done including the screen for the PSA.  He has had history of prediabetes in the past.   Outpatient Encounter Medications as of 07/19/2021  Medication Sig   aspirin EC 81 MG tablet Take 81-162 mg by mouth every 4 (four) hours as needed for mild pain. (Patient not taking: Reported on 09/20/2020)   ibuprofen (ADVIL) 100 MG/5ML suspension Take  200 mg by mouth every 4 (four) hours as needed.   tamsulosin (FLOMAX) 0.4 MG CAPS capsule Take 1 capsule (0.4 mg total) by mouth daily.   No facility-administered encounter medications on file as of 07/19/2021.      Observations/Objective: No direct observation done as this was a telephone encounter.  Assessment and Plan: 1. Benign prostatic hyperplasia with urinary frequency Refill given on Flomax. - tamsulosin (FLOMAX) 0.4 MG CAPS capsule; Take 1 capsule (0.4 mg total) by mouth daily.  Dispense: 30 capsule; Refill: 6  2. Prostate cancer screening Patient is agreeable and wanting PSA checked. - PSA; Future  3. Prediabetes We will schedule him to come to the lab to have his blood test done. - CBC; Future - Lipid panel; Future - Comprehensive metabolic panel; Future   Follow Up Instructions: 1-2 mths for physical   I discussed the assessment and treatment plan with the patient. The patient was provided an opportunity to ask questions and all were answered. The patient agreed with the plan and demonstrated an understanding of the instructions.   The patient was advised to call back or seek an in-person evaluation if the symptoms worsen or if the condition fails to improve as anticipated.  I  Spent 11 minutes on this telephone encounter  This note has been created with Surveyor, quantity. Any transcriptional errors are unintentional.  Karle Plumber, MD

## 2021-10-18 ENCOUNTER — Inpatient Hospital Stay (HOSPITAL_COMMUNITY): Payer: Self-pay

## 2021-10-18 ENCOUNTER — Inpatient Hospital Stay (HOSPITAL_COMMUNITY)
Admission: EM | Admit: 2021-10-18 | Discharge: 2021-10-19 | DRG: 066 | Disposition: A | Discharge status: Home / Self Care | Payer: Self-pay | Attending: Neurology | Admitting: Neurology

## 2021-10-18 ENCOUNTER — Other Ambulatory Visit: Payer: Self-pay

## 2021-10-18 ENCOUNTER — Emergency Department (HOSPITAL_COMMUNITY): Payer: Self-pay

## 2021-10-18 ENCOUNTER — Encounter (HOSPITAL_COMMUNITY): Payer: Self-pay

## 2021-10-18 DIAGNOSIS — I6389 Other cerebral infarction: Secondary | ICD-10-CM

## 2021-10-18 DIAGNOSIS — Z20822 Contact with and (suspected) exposure to covid-19: Secondary | ICD-10-CM | POA: Diagnosis present

## 2021-10-18 DIAGNOSIS — I619 Nontraumatic intracerebral hemorrhage, unspecified: Secondary | ICD-10-CM | POA: Diagnosis present

## 2021-10-18 DIAGNOSIS — N400 Enlarged prostate without lower urinary tract symptoms: Secondary | ICD-10-CM | POA: Diagnosis present

## 2021-10-18 DIAGNOSIS — I614 Nontraumatic intracerebral hemorrhage in cerebellum: Principal | ICD-10-CM | POA: Diagnosis present

## 2021-10-18 DIAGNOSIS — R42 Dizziness and giddiness: Secondary | ICD-10-CM | POA: Diagnosis present

## 2021-10-18 DIAGNOSIS — Z8249 Family history of ischemic heart disease and other diseases of the circulatory system: Secondary | ICD-10-CM

## 2021-10-18 DIAGNOSIS — I613 Nontraumatic intracerebral hemorrhage in brain stem: Secondary | ICD-10-CM

## 2021-10-18 DIAGNOSIS — Z833 Family history of diabetes mellitus: Secondary | ICD-10-CM

## 2021-10-18 LAB — RESP PANEL BY RT-PCR (FLU A&B, COVID) ARPGX2
Influenza A by PCR: NEGATIVE
Influenza B by PCR: NEGATIVE
SARS Coronavirus 2 by RT PCR: NEGATIVE

## 2021-10-18 LAB — DIFFERENTIAL
Abs Immature Granulocytes: 0.01 10*3/uL (ref 0.00–0.07)
Basophils Absolute: 0 10*3/uL (ref 0.0–0.1)
Basophils Relative: 0 %
Eosinophils Absolute: 0.2 10*3/uL (ref 0.0–0.5)
Eosinophils Relative: 3 %
Immature Granulocytes: 0 %
Lymphocytes Relative: 53 %
Lymphs Abs: 3.1 10*3/uL (ref 0.7–4.0)
Monocytes Absolute: 0.4 10*3/uL (ref 0.1–1.0)
Monocytes Relative: 8 %
Neutro Abs: 2.1 10*3/uL (ref 1.7–7.7)
Neutrophils Relative %: 36 %

## 2021-10-18 LAB — COMPREHENSIVE METABOLIC PANEL
ALT: 19 U/L (ref 0–44)
AST: 21 U/L (ref 15–41)
Albumin: 4 g/dL (ref 3.5–5.0)
Alkaline Phosphatase: 34 U/L — ABNORMAL LOW (ref 38–126)
Anion gap: 6 (ref 5–15)
BUN: 11 mg/dL (ref 6–20)
CO2: 24 mmol/L (ref 22–32)
Calcium: 8.6 mg/dL — ABNORMAL LOW (ref 8.9–10.3)
Chloride: 110 mmol/L (ref 98–111)
Creatinine, Ser: 1.03 mg/dL (ref 0.61–1.24)
GFR, Estimated: >60 mL/min (ref >60)
Glucose, Bld: 111 mg/dL — ABNORMAL HIGH (ref 70–99)
Potassium: 3.9 mmol/L (ref 3.5–5.1)
Sodium: 140 mmol/L (ref 135–145)
Total Bilirubin: 0.7 mg/dL (ref 0.3–1.2)
Total Protein: 6.9 g/dL (ref 6.5–8.1)

## 2021-10-18 LAB — HIV ANTIBODY (ROUTINE TESTING W REFLEX): HIV Screen 4th Generation wRfx: NONREACTIVE

## 2021-10-18 LAB — I-STAT CHEM 8, ED
BUN: 12 mg/dL (ref 6–20)
Calcium, Ion: 1 mmol/L — ABNORMAL LOW (ref 1.15–1.40)
Chloride: 108 mmol/L (ref 98–111)
Creatinine, Ser: 1 mg/dL (ref 0.61–1.24)
Glucose, Bld: 112 mg/dL — ABNORMAL HIGH (ref 70–99)
HCT: 46 % (ref 39.0–52.0)
Hemoglobin: 15.6 g/dL (ref 13.0–17.0)
Potassium: 3.9 mmol/L (ref 3.5–5.1)
Sodium: 142 mmol/L (ref 135–145)
TCO2: 23 mmol/L (ref 22–32)

## 2021-10-18 LAB — CBC
HCT: 45.7 % (ref 39.0–52.0)
Hemoglobin: 15.8 g/dL (ref 13.0–17.0)
MCH: 29.5 pg (ref 26.0–34.0)
MCHC: 34.6 g/dL (ref 30.0–36.0)
MCV: 85.3 fL (ref 80.0–100.0)
Platelets: 149 10*3/uL — ABNORMAL LOW (ref 150–400)
RBC: 5.36 MIL/uL (ref 4.22–5.81)
RDW: 12.9 % (ref 11.5–15.5)
WBC: 5.9 10*3/uL (ref 4.0–10.5)
nRBC: 0 % (ref 0.0–0.2)

## 2021-10-18 LAB — ECHOCARDIOGRAM COMPLETE
AR max vel: 3.72 cm2
AV Peak grad: 4.9 mmHg
Ao pk vel: 1.11 m/s
Area-P 1/2: 4.1 cm2
Calc EF: 63.1 %
Height: 72 in
S' Lateral: 3.2 cm
Single Plane A2C EF: 63.9 %
Single Plane A4C EF: 63.1 %
Weight: 4000 oz

## 2021-10-18 LAB — URINALYSIS, ROUTINE W REFLEX MICROSCOPIC
Bilirubin Urine: NEGATIVE
Glucose, UA: NEGATIVE mg/dL
Hgb urine dipstick: NEGATIVE
Ketones, ur: NEGATIVE mg/dL
Leukocytes,Ua: NEGATIVE
Nitrite: NEGATIVE
Protein, ur: NEGATIVE mg/dL
Specific Gravity, Urine: 1.005 (ref 1.005–1.030)
pH: 6 (ref 5.0–8.0)

## 2021-10-18 LAB — PROTIME-INR
INR: 1 (ref 0.8–1.2)
Prothrombin Time: 13.5 seconds (ref 11.4–15.2)

## 2021-10-18 LAB — RAPID URINE DRUG SCREEN, HOSP PERFORMED
Amphetamines: NOT DETECTED
Barbiturates: NOT DETECTED
Benzodiazepines: NOT DETECTED
Cocaine: NOT DETECTED
Opiates: NOT DETECTED
Tetrahydrocannabinol: NOT DETECTED

## 2021-10-18 LAB — CBG MONITORING, ED: Glucose-Capillary: 112 mg/dL — ABNORMAL HIGH (ref 70–99)

## 2021-10-18 LAB — MRSA NEXT GEN BY PCR, NASAL: MRSA by PCR Next Gen: NOT DETECTED

## 2021-10-18 LAB — APTT: aPTT: 28 seconds (ref 24–36)

## 2021-10-18 LAB — ETHANOL: Alcohol, Ethyl (B): <10 mg/dL (ref <10)

## 2021-10-18 MED ORDER — STROKE: EARLY STAGES OF RECOVERY BOOK: Freq: Once | Status: DC

## 2021-10-18 MED ORDER — IOHEXOL 350 MG/ML SOLN
75.0000 mL | Freq: Once | INTRAVENOUS | Status: AC | PRN
  Administered 2021-10-18: 75 mL via INTRAVENOUS

## 2021-10-18 MED ORDER — ACETAMINOPHEN 325 MG PO TABS: 650.0000 mg | ORAL_TABLET | ORAL | Status: DC | PRN

## 2021-10-18 MED ORDER — ACETAMINOPHEN 160 MG/5ML PO SOLN: 650.0000 mg | ORAL | Status: DC | PRN

## 2021-10-18 MED ORDER — CHLORHEXIDINE GLUCONATE CLOTH 2 % EX PADS
6.0000 | MEDICATED_PAD | Freq: Every day | CUTANEOUS | Status: DC
  Administered 2021-10-18 – 2021-10-19 (×2): 6 via TOPICAL

## 2021-10-18 MED ORDER — PANTOPRAZOLE SODIUM 40 MG IV SOLR
40.0000 mg | Freq: Every day | INTRAVENOUS | Status: DC
  Administered 2021-10-18: 40 mg via INTRAVENOUS

## 2021-10-18 MED ORDER — CLEVIDIPINE BUTYRATE 0.5 MG/ML IV EMUL
0.0000 mg/h | INTRAVENOUS | Status: DC
  Administered 2021-10-18: 4 mg/h via INTRAVENOUS
  Administered 2021-10-18: 1 mg/h via INTRAVENOUS
  Filled 2021-10-18: qty 50

## 2021-10-18 MED ORDER — ACETAMINOPHEN 650 MG RE SUPP
650.0000 mg | RECTAL | Status: DC | PRN
Start: 2021-10-18 — End: 2021-10-19

## 2021-10-18 MED ORDER — SODIUM CHLORIDE 0.9% FLUSH: 3.0000 mL | Freq: Once | INTRAVENOUS | Status: DC

## 2021-10-18 MED ORDER — SENNOSIDES-DOCUSATE SODIUM 8.6-50 MG PO TABS
1.0000 | ORAL_TABLET | Freq: Two times a day (BID) | ORAL | Status: DC
  Filled 2021-10-18: qty 1

## 2021-10-18 NOTE — H&P (Signed)
Stroke Neurology H&P Note ? ?The history was obtained from the patient.  During history and examination, all items were able to obtain unless otherwise noted. ? ?History of Present Illness:  Ian Baker is a 55 y.o. African American male with PMH of BPH admitted for HA and dizziness this morning.  Per patient he was having shower around 9 AM and had sudden onset headache, at back of the head around the neck.  It was short lasting.  About 15 minutes later, he had again severe headache at the back of head spreading to the neck, also feeling dizziness, more lightheadedness denies vertigo.  He felt nauseous but no vomiting.  Denies head trauma.  CT showed left cerebral peduncle hyperdensity, possible small amount of hemorrhage. ? ?LSN: 9 AM ?tPA Given: No: Possible ICH ? ?Past Medical History:  ?Diagnosis Date  ? Arthritis   ? Chest pain   ? Normal coronary arteries 07/14/11 with normal LV function by echo at that time, negative for PE by CT angio 07/13/11  ? Hepatic steatosis   ? Hyperglycemia   ? A1C 5.8  ? Marijuana abuse   ? smokes about 1x/month  ? Overweight(278.02)   ? Thrombocytopenia (HCC)   ? Noted 1/13 hospitalization - to f/u with PCP  ? Tobacco abuse   ? 1 thin cigar every few months  ? ? ?Past Surgical History:  ?Procedure Laterality Date  ? APPENDECTOMY    ? 12878  ? LEFT HEART CATHETERIZATION WITH CORONARY ANGIOGRAM N/A 07/14/2011  ? Procedure: LEFT HEART CATHETERIZATION WITH CORONARY ANGIOGRAM;  Surgeon: Herby Abraham, MD;  Location: [facility] CATH LAB;  Service: Cardiovascular;  Laterality: N/A;  ? ? ?Family History  ?Problem Relation Age of Onset  ? Diabetes Mother   ? Coronary artery disease Mother   ?     onset 60's, currently 81  ? Prostate cancer Maternal Uncle   ? ? ?Social History:  reports that he has never smoked. He has never used smokeless tobacco. He reports current alcohol use of about 2.0 standard drinks per week. He reports that he does not currently use drugs after having used the  following drugs: Marijuana. ? ?Allergies: No Known Allergies ? ?No current facility-administered medications on file prior to encounter.  ? ?Current Outpatient Medications on File Prior to Encounter  ?Medication Sig Dispense Refill  ? aspirin EC 81 MG tablet Take 81-162 mg by mouth every 4 (four) hours as needed for mild pain. (Patient not taking: Reported on 09/20/2020)    ? ibuprofen (ADVIL) 100 MG/5ML suspension Take 200 mg by mouth every 4 (four) hours as needed.    ? tamsulosin (FLOMAX) 0.4 MG CAPS capsule Take 1 capsule (0.4 mg total) by mouth daily. 30 capsule 6  ? ? ?Review of Systems: A full ROS was attempted today and was able to be performed.  Systems assessed include - Constitutional, Eyes, HENT, Respiratory, Cardiovascular, Gastrointestinal, Genitourinary, Integument/breast, Hematologic/lymphatic, Musculoskeletal, Neurological, Behavioral/Psych, Endocrine, Allergic/Immunologic - with pertinent responses as per HPI. ? ?Physical Examination: ?Temp:  [98.1 ?F (36.7 ?C)] 98.1 ?F (36.7 ?C) (04/28 1018) ?Pulse Rate:  [60-78] 63 (04/28 1120) ?Resp:  [10-17] 10 (04/28 1120) ?BP: (142-169)/(92-110) 169/99 (04/28 1120) ?SpO2:  [97 %-100 %] 99 % (04/28 1120) ?Weight:  [113.4 kg] 113.4 kg (04/28 1020) ? ?General - well nourished, well developed, in no apparent distress.   ? ?Ophthalmologic - fundi not visualized due to noncooperation.   ? ?Cardiovascular - regular rhythm and rate ? ?Mental Status -  ?  Level of arousal and orientation to time, place, and person were intact. ?Language including expression, naming, repetition, comprehension, reading, and writing was assessed and found intact. ?Fund of Knowledge was assessed and was intact. ? ?Cranial Nerves II - XII - ?II - Vision intact OU. ?III, IV, VI - Extraocular movements intact. ?V - Facial sensation intact bilaterally. ?VII - Facial movement intact bilaterally. ?VIII - Hearing & vestibular intact bilaterally. ?X - Palate elevates symmetrically. ?XI - Chin  turning & shoulder shrug intact bilaterally. ?XII - Tongue protrusion intact. ? ?Motor Strength - The patient?s strength was normal in all extremities and pronator drift was absent.  ? ?Motor Tone & Bulk - Muscle tone was assessed at the neck and appendages and was normal.  Bulk was normal and fasciculations were absent.  ? ?Reflexes - The patient?s reflexes were normal in all extremities and he had no pathological reflexes. ? ?Sensory - Light touch, temperature/pinprick were assessed and were normal.   ? ?Coordination - The patient had normal movements in the hands and feet with no ataxia or dysmetria.  Tremor was absent. ? ?Gait and Station - deferred ? ?Data Reviewed: ?CT HEAD WO CONTRAST ? ?Result Date: 10/18/2021 ?CLINICAL DATA:  Dizziness, unsteady gait EXAM: CT HEAD WITHOUT CONTRAST TECHNIQUE: Contiguous axial images were obtained from the base of the skull through the vertex without intravenous contrast. RADIATION DOSE REDUCTION: This exam was performed according to the departmental dose-optimization program which includes automated exposure control, adjustment of the mA and/or kV according to patient size and/or use of iterative reconstruction technique. COMPARISON:  None. FINDINGS: Brain: Focal hyperdensity present within the left cerebellar peduncle difficult to visualize on the axial views, but more visible on the sagittal and coronal reformatted views. The abnormality measures 1.3 x 1.5 x 0.4 cm. Findings are concerning for small volume intraparenchymal hemorrhage. Otherwise, there is no evidence of mass, mass-effect, hydrocephalus or extra-axial hemorrhage. Vascular: No hyperdense vessel or unexpected calcification. Skull: Normal. Negative for fracture or focal lesion. Sinuses/Orbits: No acute finding. Other: None. IMPRESSION: 1. Focal 1.3 x 1.5 x 0.4 cm high attenuation within the left cerebellar peduncle concerning for acute intraparenchymal hemorrhage. Recommend further evaluation with brain MRI. 2.  Otherwise, unremarkable CT scan of the brain. These results were called by telephone at the time of interpretation on 10/18/2021 at 10:57 am to provider Dr. Rubin PayorPickering, Who verbally acknowledged these results. Electronically Signed   By: Malachy MoanHeath  McCullough M.D.   On: 10/18/2021 11:01   ? ?Assessment: 56 y.o. male with PMH of BPH admitted for HA and dizziness this morning.  CT showed left cerebral peduncle hyperdensity, possible small amount of hemorrhage.  However, the density is not quite consistent with hematoma, therefore cannot rule out possibility of underlying vascular lesion.  We will repeat a CT in 6 hours to document stabilization and CT head and neck to rule out vascular lesion.  Will perform MRI in a.m. patient will need ICU admission for BP control ? ?Plan: ?-Strict BP management, targeting BP goal 132 and 50 ?-Continue Cleviprex as needed ?-Repeat CT in 6 hours to document stabilization ?-CT head and neck and MRI ?-ICU admission for close monitoring and BP management ?-seizure precaution ? ?Marvel PlanJindong Mikhai Bienvenue, MD PhD ?Stroke Neurology ?10/18/2021 ?8:46 PM ? ?This patient is critically ill due to possible ICH, hypertensive emergency and at significant risk of neurological worsening, death form hematoma expansion, brain herniation, cerebral edema. This patient's care requires constant monitoring of vital signs, hemodynamics, respiratory and cardiac monitoring, review  of multiple databases, neurological assessment, discussion with family, other specialists and medical decision making of high complexity. I spent 35 minutes of neurocritical care time in the care of this patient. ? ? ? ?

## 2021-10-18 NOTE — ED Triage Notes (Signed)
Pt arrived POV from home c/o a sudden left sided headache, dizziness and unsteady gait. Pt's LKW was 9am. No other neuro deficits noted.  ?

## 2021-10-18 NOTE — ED Provider Notes (Signed)
?[facility] ?Provider Note ? ? ?CSN: [id number] ?Arrival date & time: 10/18/21  1013 ? ?  ? ?History ? ?Chief Complaint  ?Patient presents with  ? Headache  ? Dizziness  ? ? ?Ian Baker is a 56 y.o. male. ? ? ?Headache ?Associated symptoms: dizziness and nausea   ?Associated symptoms: no weakness   ?Dizziness ?Associated symptoms: headaches and nausea   ?Associated symptoms: no shortness of breath and no weakness   ?Patient presents with headache and dizziness.  Began acutely at around 9 AM.  States he had just taken a shower.  States he worked last night to 11 had normal day at work.  States he developed acute severe left-sided posterior head pain.  Lasted a few seconds.  Improved for a while but about 15 minutes later had another episode that was more intense and lasted longer.  Still lasted under a minute.  Had dull headache now and feels a little unsteady.  States while he was going he felt as if he could pass out and felt a little nauseous.  Did not vomit.  No trauma. ?  ?Past Medical History:  ?Diagnosis Date  ? Arthritis   ? Chest pain   ? Normal coronary arteries 07/14/11 with normal LV function by echo at that time, negative for PE by CT angio 07/13/11  ? Hepatic steatosis   ? Hyperglycemia   ? A1C 5.8  ? Marijuana abuse   ? smokes about 1x/month  ? Overweight(278.02)   ? Thrombocytopenia (HCC)   ? Noted 1/13 hospitalization - to f/u with PCP  ? Tobacco abuse   ? 1 thin cigar every few months  ? ? ?Home Medications ?Prior to Admission medications   ?Medication Sig Start Date End Date Taking? Authorizing Provider  ?aspirin EC 81 MG tablet Take 81-162 mg by mouth every 4 (four) hours as needed for mild pain. ?Patient not taking: Reported on 09/20/2020    [provider]  ?ibuprofen (ADVIL) 100 MG/5ML suspension Take 200 mg by mouth every 4 (four) hours as needed.    [provider]  ?tamsulosin (FLOMAX) 0.4 MG CAPS capsule Take 1 capsule (0.4 mg total)  by mouth daily. 07/19/21   Marcine MatarJohnson, Deborah B, MD  ?   ? ?Allergies    ?Patient has no known allergies.   ? ?Review of Systems   ?Review of Systems  ?Constitutional:  Negative for appetite change.  ?Eyes:  Negative for visual disturbance.  ?Respiratory:  Negative for shortness of breath.   ?Gastrointestinal:  Positive for nausea.  ?Neurological:  Positive for dizziness and headaches. Negative for weakness.  ? ?Physical Exam ?Updated Vital Signs ?BP (!) 140/95   Pulse 71   Temp 98.1 ?F (36.7 ?C) (Oral)   Resp (!) 23   Ht 6' (1.829 m)   Wt 113.4 kg   SpO2 98%   BMI 33.91 kg/m?  ?Physical Exam ?Vitals and nursing note reviewed.  ?HENT:  ?   Head: Atraumatic.  ?Eyes:  ?   Extraocular Movements: Extraocular movements intact.  ?   Pupils: Pupils are equal, round, and reactive to light.  ?Cardiovascular:  ?   Rate and Rhythm: Regular rhythm.  ?Abdominal:  ?   General: There is no distension.  ?Musculoskeletal:  ?   Cervical back: Neck supple.  ?Skin: ?   General: Skin is warm.  ?Neurological:  ?   Mental Status: He is alert.  ?   Comments: Patient states that feels slightly  decreased sensation on left face.  Eye movements intact without nystagmus.  Finger-nose intact bilaterally.  Grip strength equal bilaterally.  No pronator drift.  Complete NIH scoring done by neurology.  ? ? ?ED Results / Procedures / Treatments   ?Labs ?(all labs ordered are listed, but only abnormal results are displayed) ?Labs Reviewed  ?CBC - Abnormal; Notable for the following components:  ?    Result Value  ? Platelets 149 (*)   ? All other components within normal limits  ?COMPREHENSIVE METABOLIC PANEL - Abnormal; Notable for the following components:  ? Glucose, Bld 111 (*)   ? Calcium 8.6 (*)   ? Alkaline Phosphatase 34 (*)   ? All other components within normal limits  ?I-STAT CHEM 8, ED - Abnormal; Notable for the following components:  ? Glucose, Bld 112 (*)   ? Calcium, Ion 1.00 (*)   ? All other components within normal limits  ?CBG  MONITORING, ED - Abnormal; Notable for the following components:  ? Glucose-Capillary 112 (*)   ? All other components within normal limits  ?RESP PANEL BY RT-PCR (FLU A&B, COVID) ARPGX2  ?PROTIME-INR  ?APTT  ?DIFFERENTIAL  ?ETHANOL  ?RAPID URINE DRUG SCREEN, HOSP PERFORMED  ?URINALYSIS, ROUTINE W REFLEX MICROSCOPIC  ?HIV ANTIBODY (ROUTINE TESTING W REFLEX)  ? ? ?EKG ?None ? ?Radiology ?CT HEAD WO CONTRAST ? ?Result Date: 10/18/2021 ?CLINICAL DATA:  Dizziness, unsteady gait EXAM: CT HEAD WITHOUT CONTRAST TECHNIQUE: Contiguous axial images were obtained from the base of the skull through the vertex without intravenous contrast. RADIATION DOSE REDUCTION: This exam was performed according to the departmental dose-optimization program which includes automated exposure control, adjustment of the mA and/or kV according to patient size and/or use of iterative reconstruction technique. COMPARISON:  None. FINDINGS: Brain: Focal hyperdensity present within the left cerebellar peduncle difficult to visualize on the axial views, but more visible on the sagittal and coronal reformatted views. The abnormality measures 1.3 x 1.5 x 0.4 cm. Findings are concerning for small volume intraparenchymal hemorrhage. Otherwise, there is no evidence of mass, mass-effect, hydrocephalus or extra-axial hemorrhage. Vascular: No hyperdense vessel or unexpected calcification. Skull: Normal. Negative for fracture or focal lesion. Sinuses/Orbits: No acute finding. Other: None. IMPRESSION: 1. Focal 1.3 x 1.5 x 0.4 cm high attenuation within the left cerebellar peduncle concerning for acute intraparenchymal hemorrhage. Recommend further evaluation with brain MRI. 2. Otherwise, unremarkable CT scan of the brain. These results were called by telephone at the time of interpretation on 10/18/2021 at 10:57 am to provider Dr. Rubin Payor, Who verbally acknowledged these results. Electronically Signed   By: Malachy Moan M.D.   On: 10/18/2021 11:01    ? ?Procedures ?Procedures  ? ? ?Medications Ordered in ED ?Medications  ?sodium chloride flush (NS) 0.9 % injection 3 mL (3 mLs Intravenous Not Given 10/18/21 1110)  ?clevidipine (CLEVIPREX) infusion 0.5 mg/mL (4 mg/hr Intravenous Rate/Dose Change 10/18/21 1119)  ? stroke: early stages of recovery book (has no administration in time range)  ?acetaminophen (TYLENOL) tablet 650 mg (has no administration in time range)  ?  Or  ?acetaminophen (TYLENOL) 160 MG/5ML solution 650 mg (has no administration in time range)  ?  Or  ?acetaminophen (TYLENOL) suppository 650 mg (has no administration in time range)  ?senna-docusate (Senokot-S) tablet 1 tablet (has no administration in time range)  ?pantoprazole (PROTONIX) injection 40 mg (has no administration in time range)  ? ? ?ED Course/ Medical Decision Making/ A&P ?  ?                        ?  Medical Decision Making ?Amount and/or Complexity of Data Reviewed ?Labs: ordered. ?Radiology: ordered. ? ?Risk ?Decision regarding hospitalization. ? ? ?Patient presents with acute headache and dizziness.  Began around 9:00.  Headache is coming on and then brief but has had dull headache in between.  Head CT independently interpreted and I also discussed with radiologist and shows intracranial hemorrhage in the left cerebellar peduncle.  Code stroke called due to acute onset with the dizziness as a neurologic deficit.  Also neurology would be the admitting service that we need tight blood pressure control.  Started on Cleviprex drip.  Labetalol initially ordered but blood pressure had been more contained so not given.  Also had mild bradycardia.  Not on blood thinners.  Reassuring exam overall.  Will admit to Neuro ICU ? ?CRITICAL CARE ?Performed by: Benjiman Core ?Total critical care time: 30 minutes ?Critical care time was exclusive of separately billable procedures and treating other patients. ?Critical care was necessary to treat or prevent imminent or life-threatening  deterioration. ?Critical care was time spent personally by me on the following activities: development of treatment plan with patient and/or surrogate as well as nursing, discussions with consultants, evaluation of patient's

## 2021-10-18 NOTE — Code Documentation (Signed)
Ian Baker is a 56 yr old male with past medical history of chest pain and BPH. He takes a daily baby aspirin. He had a sudden onset of severe headache and dizziness this morning at 0900. He presented to [facility] via POV at 1014. CT quickly obtained, which showed Left cerebellar ICH. Code stroke was paged at that time to facilitate rapid treatment. Pt met by [facility] in ED room 34 at 11:10. Cleviprex gtt already infusing. Pt neurologically intact except for mild sensory decrease on left face. He has a mild headache. Cleviprex titrated quickly to goal BP. Pt will need to keep BP 130-150. He will be admitted to [facility] ICU. He will need a follow up CT, with CTA head and neck, in six hours (approximately 1700). Stroke swallow screen done. Bedside handoff with Magazine features editor.  [place] ICU notified of pt. ?

## 2021-10-19 ENCOUNTER — Inpatient Hospital Stay (HOSPITAL_COMMUNITY): Payer: Self-pay

## 2021-10-19 LAB — CBC
HCT: 46.5 % (ref 39.0–52.0)
Hemoglobin: 15.9 g/dL (ref 13.0–17.0)
MCH: 29.3 pg (ref 26.0–34.0)
MCHC: 34.2 g/dL (ref 30.0–36.0)
MCV: 85.6 fL (ref 80.0–100.0)
Platelets: 157 10*3/uL (ref 150–400)
RBC: 5.43 MIL/uL (ref 4.22–5.81)
RDW: 13.1 % (ref 11.5–15.5)
WBC: 6.2 10*3/uL (ref 4.0–10.5)
nRBC: 0 % (ref 0.0–0.2)

## 2021-10-19 LAB — BASIC METABOLIC PANEL
Anion gap: 6 (ref 5–15)
BUN: 7 mg/dL (ref 6–20)
CO2: 27 mmol/L (ref 22–32)
Calcium: 9.1 mg/dL (ref 8.9–10.3)
Chloride: 108 mmol/L (ref 98–111)
Creatinine, Ser: 1 mg/dL (ref 0.61–1.24)
GFR, Estimated: >60 mL/min (ref >60)
Glucose, Bld: 114 mg/dL — ABNORMAL HIGH (ref 70–99)
Potassium: 3.7 mmol/L (ref 3.5–5.1)
Sodium: 141 mmol/L (ref 135–145)

## 2021-10-19 LAB — LIPID PANEL
Cholesterol: 157 mg/dL (ref 0–200)
HDL: 45 mg/dL (ref >40)
LDL Cholesterol: 97 mg/dL (ref 0–99)
Total CHOL/HDL Ratio: 3.5 RATIO
Triglycerides: 74 mg/dL (ref <150)
VLDL: 15 mg/dL (ref 0–40)

## 2021-10-19 LAB — HEMOGLOBIN A1C
Hgb A1c MFr Bld: 5.8 % — ABNORMAL HIGH (ref 4.8–5.6)
Mean Plasma Glucose: 119.76 mg/dL

## 2021-10-19 MED ORDER — PANTOPRAZOLE SODIUM 40 MG PO TBEC
40.0000 mg | DELAYED_RELEASE_TABLET | Freq: Every day | ORAL | Status: DC
  Administered 2021-10-19: 40 mg via ORAL
  Filled 2021-10-19: qty 1

## 2021-10-19 MED ORDER — GADOBUTROL 1 MMOL/ML IV SOLN
10.0000 mL | Freq: Once | INTRAVENOUS | Status: AC | PRN
  Administered 2021-10-19: 10 mL via INTRAVENOUS

## 2021-10-19 NOTE — Progress Notes (Signed)
?  Transition of Care (TOC) Screening Note ? ? ?Patient Details  ?Name: Jung Yurchak ?Date of Birth: 23-Jan-1966 ? ? ?Transition of Care (TOC) CM/SW Contact:    ?Windle Guard, LCSW ?Phone Number: ?10/19/2021, 8:22 AM ? ? ? ?Transition of Care Department [facility]) has reviewed patient and noted no immediate TOC needs pending continued medical work-up. TOC team will continue to monitor patient advancement through interdisciplinary progression rounds to support any identified discharge supports as needed. If new patient transition needs arise, please place a TOC consult or reach out to [facility] team.  ?  ?

## 2021-10-19 NOTE — Discharge Summary (Addendum)
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   NASAL ALLERGY NA Place 1 spray into the nose daily as needed.        LABORATORY STUDIES CBC    Component Value Date/Time   WBC 6.2 10/19/2021 0211   RBC 5.43 10/19/2021 0211   HGB 15.9 10/19/2021 0211   HGB 14.9 02/07/2020 0950   HCT 46.5 10/19/2021 0211   HCT 46.1 02/07/2020 0950   PLT 157 10/19/2021 0211   PLT 170 02/07/2020 0950   MCV 85.6 10/19/2021 0211   MCV 87 02/07/2020 0950   MCH 29.3 10/19/2021 0211   MCHC 34.2 10/19/2021 0211   RDW 13.1 10/19/2021 0211   RDW 13.2 02/07/2020 0950   LYMPHSABS 3.1 10/18/2021 1020   MONOABS 0.4 10/18/2021 1020   EOSABS 0.2 10/18/2021 1020   BASOSABS 0.0 10/18/2021 1020   CMP    Component Value Date/Time   NA 141 10/19/2021 0211   NA 142 02/07/2020 0950   K 3.7 10/19/2021 0211   CL 108 10/19/2021 0211   CO2 27 10/19/2021 0211   GLUCOSE 114 (H) 10/19/2021 0211   BUN 7 10/19/2021 0211   BUN 15 02/07/2020 0950   CREATININE 1.00 10/19/2021 0211   CALCIUM 9.1 10/19/2021 0211   PROT 6.9 10/18/2021 1020   PROT 7.0 02/07/2020 0950   ALBUMIN 4.0 10/18/2021 1020   ALBUMIN 4.5 02/07/2020 0950   AST 21 10/18/2021 1020   ALT 19 10/18/2021 1020   ALKPHOS 34 (L) 10/18/2021 1020   BILITOT 0.7 10/18/2021 1020    BILITOT 0.6 02/07/2020 0950   GFRNONAA >60 10/19/2021 0211   GFRAA 88 02/07/2020 0950   COAGS Lab Results  Component Value Date   INR 1.0 10/18/2021   INR 0.91 07/13/2011   Lipid Panel    Component Value Date/Time   CHOL 157 10/19/2021 0943   CHOL 170 02/07/2020 0950   TRIG 74 10/19/2021 0943   HDL 45 10/19/2021 0943   HDL 47 02/07/2020 0950   CHOLHDL 3.5 10/19/2021 0943   VLDL 15 10/19/2021 0943   LDLCALC 97 10/19/2021 0943   LDLCALC 108 (H) 02/07/2020 0950   HgbA1C  Lab Results  Component Value Date   HGBA1C 5.8 (H) 10/19/2021   Urinalysis    Component Value Date/Time   COLORURINE STRAW (A) 10/18/2021 1236   APPEARANCEUR CLEAR 10/18/2021 1236   LABSPEC 1.005 10/18/2021 1236   PHURINE 6.0 10/18/2021 1236   GLUCOSEU NEGATIVE 10/18/2021 1236   HGBUR NEGATIVE 10/18/2021 1236   BILIRUBINUR NEGATIVE 10/18/2021 1236   KETONESUR NEGATIVE 10/18/2021 1236   PROTEINUR NEGATIVE 10/18/2021 1236   UROBILINOGEN 0.2 07/13/2011 1043   NITRITE NEGATIVE 10/18/2021 1236  LEUKOCYTESUR NEGATIVE 10/18/2021 1236   Urine Drug Screen     Component Value Date/Time   LABOPIA NONE DETECTED 10/18/2021 1235   COCAINSCRNUR NONE DETECTED 10/18/2021 1235   LABBENZ NONE DETECTED 10/18/2021 1235   AMPHETMU NONE DETECTED 10/18/2021 1235   THCU NONE DETECTED 10/18/2021 1235   LABBARB NONE DETECTED 10/18/2021 1235    Alcohol Level    Component Value Date/Time   ETH <10 10/18/2021 1234     SIGNIFICANT DIAGNOSTIC STUDIES CT ANGIO HEAD NECK W WO CM  Result Date: 10/18/2021 CLINICAL DATA:  Stroke, hemorrhagic EXAM: CT HEAD WITHOUT CONTRAST CT ANGIOGRAPHY OF THE HEAD AND NECK TECHNIQUE: Contiguous axial images were obtained from the base of the skull through the vertex without intravenous contrast. Multidetector CT imaging of the head and neck was performed using the standard protocol during bolus administration of intravenous contrast. Multiplanar CT image reconstructions and MIPs were  obtained to evaluate the vascular anatomy. Carotid stenosis measurements (when applicable) are obtained utilizing NASCET criteria, using the distal internal carotid diameter as the denominator. RADIATION DOSE REDUCTION: This exam was performed according to the departmental dose-optimization program which includes automated exposure control, adjustment of the mA and/or kV according to patient size and/or use of iterative reconstruction technique. CONTRAST:  75mL OMNIPAQUE IOHEXOL 350 MG/ML SOLN COMPARISON:  10/18/2021 FINDINGS: CT HEAD Brain: Persistent hyperdensity along the left aspect of the pons and brachium pontis. No new findings. Ventricles and sulci are normal in size and configuration. Gray-white differentiation remains preserved. Vascular: No new finding. Skull: Calvarium is unremarkable. Sinuses/Orbits: No acute finding. Other: None. Review of the MIP images confirms the above findings CTA NECK Aortic arch: Great vessel origins are patent. Right carotid system: Patent.  No stenosis. Left carotid system: Patent.  No stenosis. Vertebral arteries: Patent and codominant.  No stenosis. Skeleton: Cervical spine degenerative changes. Other neck: Unremarkable. Upper chest: Included upper lungs are clear. Review of the MIP images confirms the above findings CTA HEAD Anterior circulation: The intracranial internal carotid arteries are patent. Anterior and middle cerebral arteries are patent. Posterior circulation: Intracranial vertebral arteries are patent. Basilar artery is patent. Bilateral posterior communicating arteries are present. Posterior cerebral arteries are patent. Venous sinuses: Patent as allowed by contrast bolus timing. Review of the MIP images confirms the above findings IMPRESSION: Stable hyperdensity along the left aspect of the pons and brachium pontis. MRI is recommended to confirm parenchymal versus extra-axial location and evaluate for slow flow vascular malformation. No abnormal vascularity.   No aneurysm or prominent draining veins. Electronically Signed   By: Guadlupe Spanish M.D.   On: 10/18/2021 17:35   CT HEAD WO CONTRAST ( )  Result Date: 10/18/2021 CLINICAL DATA:  Stroke, hemorrhagic EXAM: CT HEAD WITHOUT CONTRAST CT ANGIOGRAPHY OF THE HEAD AND NECK TECHNIQUE: Contiguous axial images were obtained from the base of the skull through the vertex without intravenous contrast. Multidetector CT imaging of the head and neck was performed using the standard protocol during bolus administration of intravenous contrast. Multiplanar CT image reconstructions and MIPs were obtained to evaluate the vascular anatomy. Carotid stenosis measurements (when applicable) are obtained utilizing NASCET criteria, using the distal internal carotid diameter as the denominator. RADIATION DOSE REDUCTION: This exam was performed according to the departmental dose-optimization program which includes automated exposure control, adjustment of the mA and/or kV according to patient size and/or use of iterative reconstruction technique. CONTRAST:  75mL OMNIPAQUE IOHEXOL 350 MG/ML SOLN COMPARISON:  10/18/2021 FINDINGS: CT HEAD Brain: Persistent hyperdensity along the left  aspect of the pons and brachium pontis. No new findings. Ventricles and sulci are normal in size and configuration. Gray-white differentiation remains preserved. Vascular: No new finding. Skull: Calvarium is unremarkable. Sinuses/Orbits: No acute finding. Other: None. Review of the MIP images confirms the above findings CTA NECK Aortic arch: Great vessel origins are patent. Right carotid system: Patent.  No stenosis. Left carotid system: Patent.  No stenosis. Vertebral arteries: Patent and codominant.  No stenosis. Skeleton: Cervical spine degenerative changes. Other neck: Unremarkable. Upper chest: Included upper lungs are clear. Review of the MIP images confirms the above findings CTA HEAD Anterior circulation: The intracranial internal carotid arteries are  patent. Anterior and middle cerebral arteries are patent. Posterior circulation: Intracranial vertebral arteries are patent. Basilar artery is patent. Bilateral posterior communicating arteries are present. Posterior cerebral arteries are patent. Venous sinuses: Patent as allowed by contrast bolus timing. Review of the MIP images confirms the above findings IMPRESSION: Stable hyperdensity along the left aspect of the pons and brachium pontis. MRI is recommended to confirm parenchymal versus extra-axial location and evaluate for slow flow vascular malformation. No abnormal vascularity.  No aneurysm or prominent draining veins. Electronically Signed   By: Guadlupe Spanish M.D.   On: 10/18/2021 17:35   CT HEAD WO CONTRAST  Result Date: 10/18/2021 CLINICAL DATA:  Dizziness, unsteady gait EXAM: CT HEAD WITHOUT CONTRAST TECHNIQUE: Contiguous axial images were obtained from the base of the skull through the vertex without intravenous contrast. RADIATION DOSE REDUCTION: This exam was performed according to the departmental dose-optimization program which includes automated exposure control, adjustment of the mA and/or kV according to patient size and/or use of iterative reconstruction technique. COMPARISON:  None. FINDINGS: Brain: Focal hyperdensity present within the left cerebellar peduncle difficult to visualize on the axial views, but more visible on the sagittal and coronal reformatted views. The abnormality measures 1.3 x 1.5 x 0.4 cm. Findings are concerning for small volume intraparenchymal hemorrhage. Otherwise, there is no evidence of mass, mass-effect, hydrocephalus or extra-axial hemorrhage. Vascular: No hyperdense vessel or unexpected calcification. Skull: Normal. Negative for fracture or focal lesion. Sinuses/Orbits: No acute finding. Other: None. IMPRESSION: 1. Focal 1.3 x 1.5 x 0.4 cm high attenuation within the left cerebellar peduncle concerning for acute intraparenchymal hemorrhage. Recommend further  evaluation with brain MRI. 2. Otherwise, unremarkable CT scan of the brain. These results were called by telephone at the time of interpretation on 10/18/2021 at 10:57 am to provider Dr. Rubin Payor, Who verbally acknowledged these results. Electronically Signed   By: Malachy Moan M.D.   On: 10/18/2021 11:01   MR BRAIN W WO CONTRAST  Result Date: 10/19/2021 CLINICAL DATA:  56 year old male with parenchymal versus subarachnoid hemorrhage along the left cerebellar peduncle on head CT after presenting with dizziness and abnormal gait. Unrevealing CTA. EXAM: MRI HEAD WITHOUT AND WITH CONTRAST TECHNIQUE: Multiplanar, multiecho pulse sequences of the brain and surrounding structures were obtained without and with intravenous contrast. CONTRAST:  10mL GADAVIST GADOBUTROL 1 MMOL/ML IV SOLN COMPARISON:  Head CT and CTA 10/18/2021. FINDINGS: Brain: Intra-axial patchy T2 and FLAIR hyperintensity centered at the left middle cerebellar peduncle (series 10, image 7) with pronounced hemorrhage related susceptibility on SWI there (series 14, image 19). Blood products are mildly T1 hypointense (series 16 image 19. And this is at or near the left 5th cranial nerve root entry zone (series 18, image 18), and the cisternal left 5th nerve does appear mildly asymmetric, T2 hyperintense on series 17, image 14. Meckel's cave and cavernous  sinuses appear to remain symmetric and normal. No associated enhancement following contrast. No definite abnormal leptomeningeal or dural thickening in the region. No abnormal flow voids or vascularity in the region. Associated susceptibility artifact on DWI, with no convincing No restricted diffusion or evidence of acute infarction. No other intracranial blood products identified. No intraventricular hemorrhage or debris identified. No midline shift, mass effect, evidence of mass lesion. Cervicomedullary junction and pituitary are within normal limits. Outside of the left cerebellar peduncle and  brainstem gray and white matter signal is within normal limits for age. No cortical encephalomalacia identified. No abnormal enhancement identified. Vascular: Major intracranial vascular flow voids are preserved. The major dural venous sinuses are enhancing and appear to be patent. Skull and upper cervical spine: Partially visible cervical spine degeneration and degenerative spinal stenosis at C3-C4 (series 9, image 13). Background bone marrow signal within normal limits. Sinuses/Orbits: Negative. Paranasal Visualized paranasal sinuses and mastoids are stable and well aerated. Other: Visible internal auditory structures appear normal. Negative visible scalp and face. IMPRESSION: 1. Confirmed small volume intra-axial hemorrhage centered at the left middle cerebellar peduncle, and at or near the left 5th cranial nerve root entry zone. No associated enhancement, evidence of a larger infarct, or an underlying lesion. No significant mass effect. But the cisternal segment of the left trigeminal nerve does appear mildly edematous. 2. Elsewhere negative for age MRI appearance of the brain. 3. Partially visible cervical spine degeneration and degenerative spinal stenosis at C3-C4. Electronically Signed   By: Odessa Fleming M.D.   On: 10/19/2021 06:27   ECHOCARDIOGRAM COMPLETE  Result Date: 10/18/2021    ECHOCARDIOGRAM REPORT   Patient Name:   ORWIN ALONZO Date of Exam: 10/18/2021 Medical Rec #:  [id number]       Height:       72.0 in Accession #:    [id number]      Weight:       250.0 lb Date of Birth:  Aug 27, 1965      BSA:          2.343 m Patient Age:    55 years        BP:           133/86 mmHg Patient Gender: M               HR:           72 bpm. Exam Location:  Inpatient Procedure: 2D Echo, Cardiac Doppler and Color Doppler Indications:    Stroke  History:        Patient has no prior history of Echocardiogram examinations.  Sonographer:    Cleatis Polka Referring Phys: [id number] Raechelle Sarti IMPRESSIONS  1. Left ventricular  ejection fraction, by estimation, is 60 to 65%. The left ventricle has normal function. The left ventricle has no regional wall motion abnormalities. Left ventricular diastolic parameters are consistent with Grade I diastolic dysfunction (impaired relaxation).  2. Right ventricular systolic function is normal. The right ventricular size is normal.  3. The mitral valve is normal in structure. No evidence of mitral valve regurgitation. No evidence of mitral stenosis.  4. The aortic valve is normal in structure. Aortic valve regurgitation is not visualized. No aortic stenosis is present.  5. The inferior vena cava is normal in size with greater than 50% respiratory variability, suggesting right atrial pressure of 3 mmHg. FINDINGS  Left Ventricle: Left ventricular ejection fraction, by estimation, is 60 to 65%. The left ventricle has normal function. The left ventricle has  no regional wall motion abnormalities. The left ventricular internal cavity size was normal in size. There is  no left ventricular hypertrophy. Left ventricular diastolic parameters are consistent with Grade I diastolic dysfunction (impaired relaxation). Right Ventricle: The right ventricular size is normal. No increase in right ventricular wall thickness. Right ventricular systolic function is normal. Left Atrium: Left atrial size was normal in size. Right Atrium: Right atrial size was normal in size. Pericardium: There is no evidence of pericardial effusion. Mitral Valve: The mitral valve is normal in structure. No evidence of mitral valve regurgitation. No evidence of mitral valve stenosis. Tricuspid Valve: The tricuspid valve is normal in structure. Tricuspid valve regurgitation is trivial. No evidence of tricuspid stenosis. Aortic Valve: The aortic valve is normal in structure. Aortic valve regurgitation is not visualized. No aortic stenosis is present. Aortic valve peak gradient measures 4.9 mmHg. Pulmonic Valve: The pulmonic valve was normal in  structure. Pulmonic valve regurgitation is not visualized. No evidence of pulmonic stenosis. Aorta: The aortic root is normal in size and structure. Venous: The inferior vena cava is normal in size with greater than 50% respiratory variability, suggesting right atrial pressure of 3 mmHg. IAS/Shunts: No atrial level shunt detected by color flow Doppler.  LEFT VENTRICLE PLAX 2D LVIDd:         5.40 cm      Diastology LVIDs:         3.20 cm      LV e' medial:    7.83 cm/s LV PW:         1.00 cm      LV E/e' medial:  7.2 LV IVS:        0.90 cm      LV e' lateral:   15.30 cm/s LVOT diam:     2.40 cm      LV E/e' lateral: 3.7 LV SV:         97 LV SV Index:   42 LVOT Area:     4.52 cm  LV Volumes (MOD) LV vol d, MOD A2C: 106.0 ml LV vol d, MOD A4C: 108.0 ml LV vol s, MOD A2C: 38.3 ml LV vol s, MOD A4C: 39.9 ml LV SV MOD A2C:     67.7 ml LV SV MOD A4C:     108.0 ml LV SV MOD BP:      68.1 ml RIGHT VENTRICLE             IVC RV Basal diam:  4.10 cm     IVC diam: 1.40 cm RV Mid diam:    2.70 cm RV S prime:     13.60 cm/s TAPSE (M-mode): 2.5 cm LEFT ATRIUM             Index        RIGHT ATRIUM           Index LA diam:        3.10 cm 1.32 cm/m   RA Area:     16.00 cm LA Vol (A2C):   27.7 ml 11.82 ml/m  RA Volume:   34.00 ml  14.51 ml/m LA Vol (A4C):   28.4 ml 12.12 ml/m LA Biplane Vol: 27.9 ml 11.91 ml/m  AORTIC VALVE AV Area (Vmax): 3.72 cm AV Vmax:        111.00 cm/s AV Peak Grad:   4.9 mmHg LVOT Vmax:      91.30 cm/s LVOT Vmean:     64.000 cm/s LVOT VTI:  0.215 m  AORTA Ao Root diam: 3.40 cm Ao Asc diam:  2.80 cm MITRAL VALVE MV Area (PHT): 4.10 cm    SHUNTS MV Decel Time: 185 msec    Systemic VTI:  0.22 m MV E velocity: 56.20 cm/s  Systemic Diam: 2.40 cm MV A velocity: 49.30 cm/s MV E/A ratio:  1.14 Donato Schultz MD Electronically signed by Donato Schultz MD Signature Date/Time: 10/18/2021/3:18:44 PM    Final       HISTORY OF PRESENT ILLNESS Patient with a history of BPH presented with HA and dizziness.    HOSPITAL COURSE Patient was found to have a small ICH in his left cerebral peduncle. His blood pressure was initially managed with Cleviprex but has since normalized.  Repeat CT and MRI demonstrated stable ICH. He has no acute symptoms now and is ready for discharge.  RN Pressure Injury Documentation:     DISCHARGE EXAM Blood pressure 131/75, pulse 65, temperature 98.2 F (36.8 C), temperature source Oral, resp. rate 15, height 6' (1.829 m), weight 113.4 kg, SpO2 94 %. General:  Alert, well-developed, well-nourished patient in no acute distress Respiratory:  Regular, unlabored respirations on room air   NEURO:  Mental Status: AA&Ox3  Speech/Language: speech is without dysarthria or aphasia.  Naming, repetition, fluency, and comprehension intact.  Cranial Nerves:  II: PERRL. Visual fields full. Color vision intact.  III, IV, VI: EOMI. Eyelids elevate symmetrically.  V: Sensation is intact to light touch and symmetrical to face.  VII: Smile is symmetrical.  VIII: hearing intact to voice. IX, X: Phonation is normal.  WU:[id number] shrug 5/5. XII: tongue is midline without fasciculations. Motor: 5/5 strength to all muscle groups tested.  Tone: is normal and bulk is normal Sensation- Intact to light touch bilaterally.  Coordination: FTN intact bilaterally, HKS: no ataxia in BLE.No drift.  Gait- deferred   Discharge Diet       Diet   Diet Heart Room service appropriate? Yes; Fluid consistency: Thin   liquids  DISCHARGE PLAN Disposition:  home No antithrombotic for secondary stroke prevention due to ICH Ongoing stroke risk factor control by Primary Care Physician at time of discharge Follow-up PCP Marcine Matar, MD in 2 weeks. Follow-up in [facility] in 4 weeks, office to schedule an appointment.   35 minutes were spent preparing discharge.  Cortney E Ernestina Columbia , MSN, AGACNP-BC [facility] See Amion for schedule and  pager information 10/19/2021 3:19 PM  ATTENDING NOTE: I reviewed above note and agree with the assessment and plan. Pt was seen and examined.   Patient MRI confirmed small left cerebellar peduncle ICH, stable from last CT.  Etiology still unclear, patient BP stable, off Cleviprex.  No BP meds needed at this time.  EF 60 to 65%, neuro intact.  LDL 97, A1c 5.8.  PT/OT no recommendation.  Patient eager to go home.  Educated patient be cautious at home with monitoring.  If symptoms recurs, call 911 for help.  [facility] at [facility] in 4 weeks.  For detailed assessment and plan, please refer to above as I have made changes wherever appropriate.   Marvel Plan, MD PhD Stroke Neurology 10/20/2021 12:10 AM

## 2021-10-19 NOTE — Discharge Instructions (Addendum)
Ian Baker, you were admitted with headache and dizziness and were found to have a small hemorrhagic stroke in the left side of your brain.  Your blood pressure was managed in the ICU overnight and is now normal.  You are ready to go home and do not need any PT or OT follow up.  Please do not use any aspirin containing products until you have been seen by the outpatient neurologist. ?

## 2021-10-19 NOTE — Progress Notes (Signed)
SLP Cancellation Note ? ?Patient Details ?Name: Ian Baker ?MRN: [id number] ?DOB: Jun 29, 1965 ? ? ?Cancelled treatment:       Reason Eval/Treat Not Completed: SLP screened, no needs identified, will sign off ? ? ?Ian Baker, Riley Nearing ?10/19/2021, 10:10 AM ?

## 2021-10-19 NOTE — Evaluation (Signed)
Occupational Therapy Evaluation ?Patient Details ?Name: Ian Baker ?MRN: [id number] ?DOB: Aug 10, 1965 ?Today's Date: 10/19/2021 ? ? ?History of Present Illness 56 y.o. male with PMH of BPH admitted for HA and dizziness. Per patient he was having shower around 9 AM and had sudden onset headache and unsteadiness.  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  ? ?Clinical Impression ?  ?Patient admitted for the diagnosis above.  PTA he lives alone, works full time, and needed no assist with any aspect of care or mobility.  Patient remarks all symptoms have resolved, and he presents at his baseline for in room mobility and ADL completion at sit/stand level.  No OT needs identified, and no post acute OT anticipated.   ?   ? ?Recommendations for follow up therapy are one component of a multi-disciplinary discharge planning process, led by the attending physician.  Recommendations may be updated based on patient status, additional functional criteria and insurance authorization.  ? ?Follow Up Recommendations ? No OT follow up  ?  ?Assistance Recommended at Discharge None  ?Patient can return home with the following   ? ?  ?Functional Status Assessment ? Patient has not had a recent decline in their functional status  ?Equipment Recommendations ? None recommended by OT  ?  ?Recommendations for Other Services   ? ? ?  ?Precautions / Restrictions Precautions ?Precautions: None ?Restrictions ?Weight Bearing Restrictions: No  ? ?  ? ?Mobility Bed Mobility ?Overal bed mobility: Independent ?  ?  ?  ?  ?  ?  ?  ?  ? ?Transfers ?Overall transfer level: Independent ?  ?  ?  ?  ?  ?  ?  ?  ?General transfer comment: in room mobility/toileting ?  ? ?  ?Balance Overall balance assessment: No apparent balance deficits (not formally assessed) ?  ?  ?  ?  ?  ?  ?  ?  ?  ?  ?  ?  ?  ?  ?  ?  ?  ?  ?   ? ?ADL either performed or assessed with clinical judgement  ? ?ADL Overall ADL's  : At baseline ?  ?  ?  ?  ?  ?  ?  ?  ?  ?  ?  ?  ?  ?  ?  ?  ?  ?  ?  ?   ? ? ? ?Vision Patient Visual Report: No change from baseline ?   ?   ?Perception Perception ?Perception: Within Functional Limits ?  ?Praxis Praxis ?Praxis: Intact ?  ? ?Pertinent Vitals/Pain Pain Assessment ?Pain Assessment: No/denies pain  ? ? ? ?Hand Dominance Right ?  ?Extremity/Trunk Assessment Upper Extremity Assessment ?Upper Extremity Assessment: Overall WFL for tasks assessed ?  ?Lower Extremity Assessment ?Lower Extremity Assessment: Defer to PT evaluation ?  ?Cervical / Trunk Assessment ?Cervical / Trunk Assessment: Normal ?  ?Communication Communication ?Communication: No difficulties ?  ?Cognition Arousal/Alertness: Awake/alert ?Behavior During Therapy: [facility] for tasks assessed/performed ?Overall Cognitive Status: Within Functional Limits for tasks assessed ?  ?  ?  ?  ?  ?  ?  ?  ?  ?  ?  ?  ?  ?  ?  ?  ?  ?  ?  ?General Comments   VSS on RA ? ?  ?Exercises   ?  ?Shoulder Instructions    ? ? ?Home Living Family/patient expects to be discharged to:: Private residence ?Living  Arrangements: Alone ?  ?Type of Home: Apartment ?Home Access: Stairs to enter ?Entrance Stairs-Number of Steps: 21 ?Entrance Stairs-Rails: Can reach both ?Home Layout: One level ?  ?  ?Bathroom Shower/Tub: Walk-in shower ?  ?Bathroom Toilet: Standard ?Bathroom Accessibility: Yes ?How Accessible: Accessible via walker ?Home Equipment: None ?  ?  ?  ? ?  ?Prior Functioning/Environment Prior Level of Function : Independent/Modified Independent;Working/employed;Driving ?  ?  ?  ?  ?  ?  ?  ?ADLs Comments: Works as a Engineer, materials ?  ? ?  ?  ?OT Problem List: Impaired balance (sitting and/or standing) ?  ?   ?OT Treatment/Interventions:    ?  ?OT Goals(Current goals can be found in the care plan section) Acute Rehab OT Goals ?Patient Stated Goal: Return home ?OT Goal Formulation: With patient ?Time For Goal Achievement: 10/21/21 ?Potential to Achieve Goals: Good   ?OT Frequency:   ?  ? ?Co-evaluation   ?  ?  ?  ?  ? ?  ?AM-PAC OT "6 Clicks" Daily Activity     ?Outcome Measure Help from another person eating meals?: None ?Help from another person taking care of personal grooming?: None ?Help from another person toileting, which includes using toliet, bedpan, or urinal?: None ?Help from another person bathing (including washing, rinsing, drying)?: None ?Help from another person to put on and taking off regular upper body clothing?: None ?Help from another person to put on and taking off regular lower body clothing?: None ?6 Click Score: 24 ?  ?End of Session Nurse Communication: Mobility status ? ?Activity Tolerance: Patient tolerated treatment well ?Patient left: in bed;with call bell/phone within reach ? ?OT Visit Diagnosis: Unsteadiness on feet (R26.81)  ?              ?Time: 9675-9163 ?OT Time Calculation (min): 18 min ?Charges:  OT General Charges ?$OT Visit: 1 Visit ?OT Evaluation ?$OT Eval Moderate Complexity: 1 Mod ? ?10/19/2021 ? ?RP, OTR/L ? ?Acute Rehabilitation Services ? ?Office:  [phone] ? ? ?Kentley Cedillo D Othello Dickenson ?10/19/2021, 10:38 AM ?

## 2021-10-19 NOTE — Progress Notes (Signed)
AVS education material and paperwork explained thoroughly; understanding confirmed through teach-back. Belongings including clothes, shoes, cell phone, wallet, and glasses all returned to patient. ? ?Patient insists on driving himself home. Per stroke NP de Saintclair Halsted, that although we would prefer he call a family member or friend to drive him home, patient has been cleared fully by both PT and OT and he is okay to drive home if he insists. Notified patient of this and he still insists on driving home. ?

## 2021-10-19 NOTE — Evaluation (Signed)
Physical Therapy Evaluation ?Patient Details ?Name: Ian Baker ?MRN: [id number] ?DOB: Sep 17, 1965 ?Today's Date: 10/19/2021 ? ?History of Present Illness ? 56 y.o. male admitted 4/28 for HA and dizziness. Per patient he was having shower around 9 AM and had sudden onset headache and unsteadiness.  [id number] small volume intra-axial hemorrhage centered at the  left middle cerebellar peduncle. PMH includes: BPH ?  ?Clinical Impression ? Pt in bed upon arrival of PT, agreeable to evaluation at this time. 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 The pt was able to demo good stability with all OOB mobility, even with dynamic balance challenge. Pt feels to be at physical baseline, no further acute PT needs identified at this time. He completed 250 ft ambulation without instability or need for support, 12 stairs with alternating pattern and no use of rails, and scored 23/24 on DGI which indicates he is not at increased risk for falls. Thank you for the consult, acute PT will sign off.  ? ?Dynamic Gait Index (DGI): 23/24 (<19 indicates increased risk for falls)  ?   ? ?Recommendations for follow up therapy are one component of a multi-disciplinary discharge planning process, led by the attending physician.  Recommendations may be updated based on patient status, additional functional criteria and insurance authorization. ? ?Follow Up Recommendations No PT follow up ? ?  ?Assistance Recommended at Discharge PRN  ?Patient can return home with the following ?   ? ?  ?Equipment Recommendations None recommended by PT  ?Recommendations for Other Services ?    ?  ?Functional Status Assessment Patient has not had a recent decline in their functional status  ? ?  ?Precautions / Restrictions Precautions ?Precautions: None ?Restrictions ?Weight Bearing Restrictions: No  ? ?  ? ?Mobility ? Bed Mobility ?Overal bed mobility: Independent ?   ?  ?  ?  ?  ?  ?  ?  ? ?Transfers ?Overall transfer level: Independent ?Equipment used: None ?  ?  ?  ?  ?  ?  ?  ?General transfer comment: no evidence of instability or lack of strength ?  ? ?Ambulation/Gait ?Ambulation/Gait assistance: Independent ?Gait Distance (Feet): 250 Feet ?Assistive device: None ?Gait Pattern/deviations: WFL(Within Functional Limits) ?Gait velocity: 1.0 m/s ?Gait velocity interpretation: >2.62 ft/sec, indicative of community ambulatory ?  ?General Gait Details: no evidence of instability, normal gait speed ? ?Stairs ?Stairs: Yes ?Stairs assistance: Independent ?Stair Management: No rails, Alternating pattern, Forwards ?Number of Stairs: 12 ?General stair comments: used rail to descend, no instability. VSS ? ?Wheelchair Mobility ?  ? ?Modified Rankin (Stroke Patients Only) ?Modified Rankin (Stroke Patients Only) ?Pre-Morbid Rankin Score: No symptoms ?Modified Rankin: Moderate disability ? ?  ? ?Balance Overall balance assessment: No apparent balance deficits (not formally assessed) ?  ?  ?  ?  ?  ?  ?  ?  ?  ?  ?  ?  ?  ?  ?  ?Standardized Balance Assessment ?Standardized Balance Assessment : Dynamic Gait Index ?  ?Dynamic Gait Index ?Level Surface: Normal ?Change in Gait Speed: Normal ?Gait with Horizontal Head Turns: Normal ?Gait with Vertical Head Turns: Normal ?Gait and Pivot Turn: Normal ?Step Over Obstacle: Mild Impairment ?Step Around Obstacles: Normal ?Steps: Normal ?Total Score: 23 ?   ? ? ? ?Pertinent Vitals/Pain Pain Assessment ?Pain Assessment: No/denies pain  ? ? ?Home Living Family/patient expects to be discharged to::  Private residence ?Living Arrangements: Alone ?Available Help at Discharge: Friend(s);Available PRN/intermittently ?Type of Home: Apartment ?Home Access: Stairs to enter ?Entrance Stairs-Rails: Can reach both ?Entrance Stairs-Number of Steps: 21 ?  ?Home Layout: One level ?Home Equipment: None ?   ?  ?Prior Function Prior Level of Function :  Independent/Modified Independent;Working/employed;Driving ?  ?  ?  ?  ?  ?  ?Mobility Comments: indepedent, bikes to work each day and swims consistently for exercise ?ADLs Comments: Works as a Engineer, materials ?  ? ? ?Hand Dominance  ? Dominant Hand: Right ? ?  ?Extremity/Trunk Assessment  ? Upper Extremity Assessment ?Upper Extremity Assessment: Overall WFL for tasks assessed ?  ? ?Lower Extremity Assessment ?Lower Extremity Assessment: Overall WFL for tasks assessed ?  ? ?Cervical / Trunk Assessment ?Cervical / Trunk Assessment: Normal  ?Communication  ? Communication: No difficulties  ?Cognition Arousal/Alertness: Awake/alert ?Behavior During Therapy: [facility] for tasks assessed/performed ?Overall Cognitive Status: Within Functional Limits for tasks assessed ?  ?  ?  ?  ?  ?  ?  ?  ?  ?  ?  ?  ?  ?  ?  ?  ?  ?  ?  ? ?  ?General Comments General comments (skin integrity, edema, etc.): VSS on RA, BP 114/72 after activity ? ?  ?   ? ?Assessment/Plan  ?  ?PT Assessment Patient does not need any further PT services  ?   ?   ? ?PT Goals (Current goals can be found in the Care Plan section)  ?Acute Rehab PT Goals ?Patient Stated Goal: return to work tomorrow ?PT Goal Formulation: All assessment and education complete, DC therapy ? ?  ? ?AM-PAC PT "6 Clicks" Mobility  ?Outcome Measure Help needed turning from your back to your side while in a flat bed without using bedrails?: None ?Help needed moving from lying on your back to sitting on the side of a flat bed without using bedrails?: None ?Help needed moving to and from a bed to a chair (including a wheelchair)?: None ?Help needed standing up from a chair using your arms (e.g., wheelchair or bedside chair)?: None ?Help needed to walk in hospital room?: None ?Help needed climbing 3-5 steps with a railing? : None ?6 Click Score: 24 ? ?  ?End of Session Equipment Utilized During Treatment: Gait belt ?Activity Tolerance: Patient tolerated treatment well ?Patient left: in  bed;with call bell/phone within reach ?Nurse Communication: Mobility status ?PT Visit Diagnosis: Other abnormalities of gait and mobility (R26.89) ?  ? ?Time: 1856-3149 ?PT Time Calculation (min) (ACUTE ONLY): 18 min ? ? ?Charges:   PT Evaluation ?$PT Eval Low Complexity: 1 Low ?  ?  ?   ? ? ?Vickki Muff, PT, DPT  ? ?Acute Rehabilitation Department ?Pager #: [phone] ? ?Ronnie Derby ?10/19/2021, 11:13 AM ? ?

## 2021-10-20 ENCOUNTER — Other Ambulatory Visit: Payer: Self-pay | Admitting: Neurology

## 2021-10-20 DIAGNOSIS — I613 Nontraumatic intracerebral hemorrhage in brain stem: Secondary | ICD-10-CM

## 2021-10-21 ENCOUNTER — Telehealth: Payer: Self-pay

## 2021-10-21 NOTE — Telephone Encounter (Signed)
Transition Care Management Unsuccessful Follow-up Telephone Call ? ?Date of discharge and from where:  10/19/2021, [facility] ? ?Attempts:  1st Attempt ? ?Reason for unsuccessful TCM follow-up call:  Left voice message # [phone], call back requested.  ? ?Patient has appointment at [facility] with Dr Wynetta Emery 11/22/2021. ? ? ? ?

## 2021-10-22 ENCOUNTER — Telehealth: Payer: Self-pay

## 2021-10-22 NOTE — Telephone Encounter (Signed)
Transition Care Management Follow-up Telephone Call ?Date of discharge and from where: 10/19/2021, [facility]  ?How have you been since you were released from the hospital? 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  He said he does a lot of walking with his job and he has always been physically active - running, swimming. Marland Kitchen  ?Any questions or concerns? No ? ?Items Reviewed: ?Did the pt receive and understand the discharge instructions provided? Yes  ?Medications obtained and verified?  He said he does not take any  medications.  ?Other? No  ?Any new allergies since your discharge? No  ?Dietary orders reviewed? Yes - he said he is really trying to monitor his sodium intake  ?Do you have support at home? Yes , has people who check on him  ? ?Home Care and Equipment/Supplies: ?Were home health services ordered? no ?If so, what is the name of the agency? N/a  ?Has the agency set up a time to come to the patient's home? not applicable ?Were any new equipment or medical supplies ordered?  No ?What is the name of the medical supply agency? N/a ?Were you able to get the supplies/equipment? not applicable ?Do you have any questions related to the use of the equipment or supplies? No ? ?Functional Questionnaire: (I = Independent and D = Dependent) ?ADLs: independent ? ?Follow up appointments reviewed: ? ?PCP Hospital f/u appt confirmed? Yes  Scheduled to see Dr Wynetta Emery - 11/22/2021.  He said he scheduled this because he is off on Fridays.  ?[facility] f/u appt confirmed? Yes  Scheduled to see neurology- 11/29/2021.  ?Are transportation arrangements needed? No  ?If their condition worsens, is the pt aware to call PCP or go to the Emergency Dept.? Yes ?Was the patient provided with contact information for the PCP's office or ED? Yes ?Was to pt encouraged to call back with questions or concerns? Yes ? ?

## 2021-10-23 ENCOUNTER — Ambulatory Visit: Payer: Self-pay

## 2021-10-23 NOTE — Telephone Encounter (Signed)
?  Chief Complaint: fingernail injury ?Symptoms: R middle fingernail redness, swelling, and pain ?Frequency: several weeks ?Pertinent Negatives: NA ?Disposition: [] ED /[] Urgent Care (no appt availability in office) / [] Appointment(In office/virtual)/ []  [facility]/ [] Home Care/ [x] Refused Recommended Disposition /[] [facility]/ []  Follow-up with PCP ?Additional Notes: pt wanted to see if he could be seen prior to 11/22/21 appt. Advised him of several appts including today at 1500 with , but pt refused several appt options d/t work schedule. States he would wait until appt with Dr. and if he gets worse will call back to see if any openings or go to UC/ED.  ? ?Summary: right middle finger  ? The patient has concerns with the fingernail on their middle finger  ? ?The patient has experienced significant discomfort on their right hand  ? ?The patient has noticed minor swelling at the base of their fingernail  ? ?The patient has been using OTC medication to help with their discomfort but it has been ineffective  ? ?Please contact further when possible  ?  ? ?Reason for Disposition ? Redness and painful skin around fingernail (cuticle, nailfold) ? ?Answer Assessment - Initial Assessment Questions ?1. ONSET: "When did the pain start?"  ?    Several weeks  ?2. LOCATION and RADIATION: "Where is the pain located?"  (e.g., fingertip, around nail, joint, entire  ?finger)  ?    R fingernail on R middle finger  ?3. SEVERITY: "How bad is the pain?" "What does it keep you from doing?"   (Scale 1-10; or mild, moderate, severe) ? - MILD (1-3): doesn't interfere with normal activities.  ? - MODERATE (4-7): interferes with normal activities or awakens from sleep. ? - SEVERE (8-10): excruciating pain, unable to hold a glass of water or bend finger even a little. ?    Mild ?4. APPEARANCE: "What does the finger look like?" (e.g., redness, swelling, bruising, pallor) ?    Redness at nail bed and slight  swelling  ?8. OTHER SYMPTOMS: "Do you have any other symptoms?" (e.g., fever, neck pain, numbness) ?    No ? ?Protocols used: Finger Pain-A-AH ? ?

## 2021-10-23 NOTE — Telephone Encounter (Signed)
Will forward to provider pt has an appt with provider on 11/22/2021 ?

## 2021-11-22 ENCOUNTER — Encounter: Payer: Self-pay | Admitting: Internal Medicine

## 2021-11-22 ENCOUNTER — Ambulatory Visit: Payer: Self-pay | Attending: Internal Medicine | Admitting: Internal Medicine

## 2021-11-22 VITALS — BP 106/72 | HR 74 | Temp 98.5°F | Resp 16 | Wt 235.0 lb

## 2021-11-22 DIAGNOSIS — N401 Enlarged prostate with lower urinary tract symptoms: Secondary | ICD-10-CM

## 2021-11-22 DIAGNOSIS — Z125 Encounter for screening for malignant neoplasm of prostate: Secondary | ICD-10-CM

## 2021-11-22 DIAGNOSIS — R350 Frequency of micturition: Secondary | ICD-10-CM

## 2021-11-22 DIAGNOSIS — Z23 Encounter for immunization: Secondary | ICD-10-CM

## 2021-11-22 DIAGNOSIS — E669 Obesity, unspecified: Secondary | ICD-10-CM

## 2021-11-22 DIAGNOSIS — Z1211 Encounter for screening for malignant neoplasm of colon: Secondary | ICD-10-CM

## 2021-11-22 DIAGNOSIS — Z09 Encounter for follow-up examination after completed treatment for conditions other than malignant neoplasm: Secondary | ICD-10-CM

## 2021-11-22 DIAGNOSIS — R7303 Prediabetes: Secondary | ICD-10-CM

## 2021-11-22 NOTE — Progress Notes (Signed)
Would like PSA and other lab test today.

## 2021-11-22 NOTE — Progress Notes (Signed)
Patient ID: Ian Baker, male    DOB: 02/26/1966  MRN: [id number]  CC: hosp f/u   Subjective: Ian Baker is a 57 y.o. male who presents for f/u hosp His concerns today include:  Patient with history of BPH, obesity, prediabetes, colon polyps, HL.Marland Kitchen  Since last visit with me, patient hospitalized 4/28-29/2023 with ICH in the left cerebral peduncle.  He had presented with headache.  Blood pressure was initially elevated but had normalized.  He was not sent out on any blood pressure medication.  Repeat CT and MRI demonstrated stable ICH.  Discharged home in stable condition.  Today: He has not had any headache, blurred vision, nausea or vomiting since hospital discharge.  BPH: He stopped taking Flomax.  Reports he wakes up 1-3 times at night to urinate but attributes this more so to drinking plenty of water during the day and at bedtime.  Urine flow is good except for decreased force of stream.  Would like PSA checked.  Prediabetes: Exercises regularly with strength training.  He walks a lot on his job and also rides his bike several times a week. Doing well with eating habits.  Getting in lots of fruits and vegetables.  Monitors his portion size.  HM: Due for Shingrix vaccine.  He is agreeable to receiving it today but we are currently out of it. Patient Active Problem List   Diagnosis Date Noted   ICH (intracerebral hemorrhage) (HCC) 10/18/2021   Prediabetes 02/08/2020   Hyperlipidemia with target LDL less than 100 02/08/2020   Benign prostatic hyperplasia with urinary frequency 02/07/2020   Class 1 obesity due to excess calories without serious comorbidity with body mass index (BMI) of 34.0 to 34.9 in adult 02/07/2020   History of colon polyps 02/07/2020   Chest pain      Current Outpatient Medications on File Prior to Visit  Medication Sig Dispense Refill   ibuprofen (ADVIL) 200 MG tablet Take 200 mg by mouth in the morning and at bedtime.     Cromolyn Sodium (NASAL  ALLERGY NA) Place 1 spray into the nose daily as needed. (Patient not taking: Reported on 11/22/2021)     No current facility-administered medications on file prior to visit.    No Known Allergies  Social History   Socioeconomic History   Marital status: Single    Spouse name: Not on file   Number of children: 2   Years of education: Not on file   Highest education level: Associate degree: occupational, Scientist, product/process development, or vocational program  Occupational History   Occupation: Cab Air traffic controller: [employer]  Tobacco Use   Smoking status: Never   Smokeless tobacco: Never   Tobacco comments:    smokes 1 cigar every month  Vaping Use   Vaping Use: Never used  Substance and Sexual Activity   Alcohol use: Yes    Alcohol/week: 2.0 standard drinks    Types: 2 Cans of beer per week    Comment: has a few drinks/month   Drug use: Not Currently    Types: Marijuana    Comment: uses marijuana about 1x/month   Sexual activity: Yes  Other Topics Concern   Not on file  Social History Narrative   Not on file   Social Determinants of Health   Financial Resource Strain: Not on file  Food Insecurity: Not on file  Transportation Needs: Not on file  Physical Activity: Not on file  Stress: Not on file  Social Connections: Not  on file  Intimate Partner Violence: Not on file    Family History  Problem Relation Age of Onset   Diabetes Mother    Coronary artery disease Mother        onset 7650's, currently 3565   Prostate cancer Maternal Uncle     Past Surgical History:  Procedure Laterality Date   APPENDECTOMY     20005   LEFT HEART CATHETERIZATION WITH CORONARY ANGIOGRAM N/A 07/14/2011   Procedure: LEFT HEART CATHETERIZATION WITH CORONARY ANGIOGRAM;  Surgeon: Herby Abrahamhomas D Stuckey, MD;  [facility] C[facility] LAB;  Service: Cardiovascular;  Laterality: N/A;    ROS: Review of Systems Negative except as stated above  PHYSICAL EXAM: BP 106/72 (BP Location: Left Arm, Patient Position:  Sitting, Cuff Size: Large)   Pulse 74   Temp 98.5 F (36.9 C)   Resp 16   Wt 235 lb (106.6 kg)   SpO2 97%   BMI 31.87 kg/m   Physical Exam   General appearance - alert, well appearing, middle-aged African-American male and in no distress Mental status - normal mood, behavior, speech, dress, motor activity, and thought processes Neck - supple, no significant adenopathy Chest - clear to auscultation, no wheezes, rales or rhonchi, symmetric air entry Heart - normal rate, regular rhythm, normal S1, S2, no murmurs, rubs, clicks or gallops Neurological - cranial nerves II through XII intact, motor and sensory grossly normal bilaterally Extremities - peripheral pulses normal, no pedal edema, no clubbing or cyanosis     Latest Ref Rng & Units 10/19/2021    2:11 AM 10/18/2021   10:34 AM 10/18/2021   10:20 AM  CMP  Glucose 70 - 99 mg/dL 295114   621112   308111    BUN 6 - 20 mg/dL 7   12   11     Creatinine 0.61 - 1.24 mg/dL 6.571.00   8.461.00   9.621.03    Sodium 135 - 145 mmol/L 141   142   140    Potassium 3.5 - 5.1 mmol/L 3.7   3.9   3.9    Chloride 98 - 111 mmol/L 108   108   110    CO2 22 - 32 mmol/L 27    24    Calcium 8.9 - 10.3 mg/dL 9.1    8.6    Total Protein 6.5 - 8.1 g/dL   6.9    Total Bilirubin 0.3 - 1.2 mg/dL   0.7    Alkaline Phos 38 - 126 U/L   34    AST 15 - 41 U/L   21    ALT 0 - 44 U/L   19     Lipid Panel     Component Value Date/Time   CHOL 157 10/19/2021 0943   CHOL 170 02/07/2020 0950   TRIG 74 10/19/2021 0943   HDL 45 10/19/2021 0943   HDL 47 02/07/2020 0950   CHOLHDL 3.5 10/19/2021 0943   VLDL 15 10/19/2021 0943   LDLCALC 97 10/19/2021 0943   LDLCALC 108 (H) 02/07/2020 0950    CBC    Component Value Date/Time   WBC 6.2 10/19/2021 0211   RBC 5.43 10/19/2021 0211   HGB 15.9 10/19/2021 0211   HGB 14.9 02/07/2020 0950   HCT 46.5 10/19/2021 0211   HCT 46.1 02/07/2020 0950   PLT 157 10/19/2021 0211   PLT 170 02/07/2020 0950   MCV 85.6 10/19/2021 0211   MCV 87  02/07/2020 0950   MCH 29.3 10/19/2021 0211  MCHC 34.2 10/19/2021 0211   RDW 13.1 10/19/2021 0211   RDW 13.2 02/07/2020 0950   LYMPHSABS 3.1 10/18/2021 1020   MONOABS 0.4 10/18/2021 1020   EOSABS 0.2 10/18/2021 1020   BASOSABS 0.0 10/18/2021 1020    ASSESSMENT AND PLAN: 1. Hospital discharge follow-up Patient stable post small ICH.  Currently asymptomatic.  Blood pressure normal and does not require medication.  2. Benign prostatic hyperplasia with urinary frequency Has mild nocturia.  He has discontinued the Flomax and does not feel that he needs it at this time.  3. Prostate cancer screening He desires prostate cancer screening with PSA - PSA  4. Prediabetes 5. Obesity (BMI 30.0-34.9) Commended him on healthy eating habits.  Encouraged him to keep up the good work.  Encouraged to continue regular exercise.  6. Screening for colon cancer - Fecal occult blood, imunochemical([facility])  7. Need for shingles vaccine We will give him an appointment with the clinical pharmacist in 2 weeks to get the Shingrix vaccine.     Patient was given the opportunity to ask questions.  Patient verbalized understanding of the plan and was able to repeat key elements of the plan.   This documentation was completed using Paediatric nurse.  Any transcriptional errors are unintentional.  No orders of the defined types were placed in this encounter.    Requested Prescriptions    No prescriptions requested or ordered in this encounter    No follow-ups on file.  Jonah Blue, MD, FACP

## 2021-11-23 LAB — PSA: Prostate Specific Ag, Serum: 1.8 ng/mL (ref 0.0–4.0)

## 2021-11-27 LAB — FECAL OCCULT BLOOD, IMMUNOCHEMICAL: Fecal Occult Bld: NEGATIVE

## 2021-11-29 ENCOUNTER — Inpatient Hospital Stay: Payer: Self-pay | Admitting: Neurology

## 2022-01-03 ENCOUNTER — Ambulatory Visit: Payer: Self-pay | Attending: Internal Medicine | Admitting: Pharmacist

## 2022-01-03 DIAGNOSIS — Z23 Encounter for immunization: Secondary | ICD-10-CM

## 2022-01-03 NOTE — Progress Notes (Signed)
Patient presents for vaccination against zoster per orders of Dr. Johnson. Consent given. Counseling provided. No contraindications exists. Vaccine administered without incident.   Luke Van Ausdall, PharmD, BCACP, CPP Clinical Pharmacist [facility] [phone]  

## 2022-01-10 ENCOUNTER — Encounter: Payer: Self-pay | Admitting: Neurology

## 2022-01-10 ENCOUNTER — Inpatient Hospital Stay: Payer: Self-pay | Admitting: Neurology

## 2022-03-28 ENCOUNTER — Ambulatory Visit: Payer: Self-pay | Admitting: Internal Medicine

## 2022-07-30 ENCOUNTER — Encounter (HOSPITAL_COMMUNITY): Payer: Self-pay

## 2022-07-30 ENCOUNTER — Ambulatory Visit: Payer: Self-pay

## 2022-07-30 ENCOUNTER — Emergency Department (HOSPITAL_COMMUNITY): Payer: Self-pay

## 2022-07-30 ENCOUNTER — Emergency Department (HOSPITAL_COMMUNITY)
Admission: EM | Admit: 2022-07-30 | Discharge: 2022-07-30 | Disposition: A | Discharge status: Home / Self Care | Payer: Self-pay | Attending: Emergency Medicine | Admitting: Emergency Medicine

## 2022-07-30 ENCOUNTER — Other Ambulatory Visit: Payer: Self-pay

## 2022-07-30 DIAGNOSIS — R002 Palpitations: Secondary | ICD-10-CM | POA: Insufficient documentation

## 2022-07-30 LAB — BASIC METABOLIC PANEL
Anion gap: 8 (ref 5–15)
BUN: 12 mg/dL (ref 6–20)
CO2: 25 mmol/L (ref 22–32)
Calcium: 8.6 mg/dL — ABNORMAL LOW (ref 8.9–10.3)
Chloride: 107 mmol/L (ref 98–111)
Creatinine, Ser: 1.06 mg/dL (ref 0.61–1.24)
GFR, Estimated: >60 mL/min (ref >60)
Glucose, Bld: 97 mg/dL (ref 70–99)
Potassium: 3.9 mmol/L (ref 3.5–5.1)
Sodium: 140 mmol/L (ref 135–145)

## 2022-07-30 LAB — TROPONIN I (HIGH SENSITIVITY)
Troponin I (High Sensitivity): 4 ng/L (ref <18)
Troponin I (High Sensitivity): 4 ng/L (ref <18)

## 2022-07-30 LAB — TSH: TSH: 1.356 u[IU]/mL (ref 0.350–4.500)

## 2022-07-30 LAB — CBC
HCT: 45 % (ref 39.0–52.0)
Hemoglobin: 15.5 g/dL (ref 13.0–17.0)
MCH: 30 pg (ref 26.0–34.0)
MCHC: 34.4 g/dL (ref 30.0–36.0)
MCV: 87.2 fL (ref 80.0–100.0)
Platelets: 152 10*3/uL (ref 150–400)
RBC: 5.16 MIL/uL (ref 4.22–5.81)
RDW: 12.5 % (ref 11.5–15.5)
WBC: 4.7 10*3/uL (ref 4.0–10.5)
nRBC: 0 % (ref 0.0–0.2)

## 2022-07-30 LAB — T4, FREE: Free T4: 0.92 ng/dL (ref 0.61–1.12)

## 2022-07-30 NOTE — ED Triage Notes (Signed)
Reports fast heart rate and sob with chest pain x 36 hours.

## 2022-07-30 NOTE — Discharge Instructions (Addendum)
Today's evaluation has been reassuring but with your history please follow-up with your physician and our cardiology colleagues will contact you for an evaluation as well.  Return here for concerning changes in your condition.

## 2022-07-30 NOTE — ED Notes (Signed)
pT TO ct

## 2022-07-30 NOTE — ED Provider Triage Note (Signed)
Emergency Medicine Provider Triage Evaluation Note  Ian Baker , a 57 y.o. male  was evaluated in triage.  Pt complains of intermittent episodes of racing heart, palpitations and shortness of breath.  He has had this repeatedly several times however has been increasing in frequency.  He has mild associated chest pain and shortness of breath and some diaphoresis when it occurs.  Last from and anywhere from 1 to 5 minutes.  Had several episodes yesterday, called PCP who told him to come in for evaluation  Review of Systems  Positive: Palpitations Negative: Syncope or near syncope  Physical Exam  BP 114/63 (BP Location: Right Arm)   Pulse 81   Temp 98.3 F (36.8 C) (Oral)   Resp 19   Ht 6' (1.829 m)   Wt 106.6 kg   SpO2 97%   BMI 31.87 kg/m  Gen:   Awake, no distress   Resp:  Normal effort  MSK:   Moves extremities without difficulty  Other:  RRR  Medical Decision Making  Medically screening exam initiated at 12:59 PM.  Appropriate orders placed.  Ian Baker was informed that the remainder of the evaluation will be completed by another provider, this initial triage assessment does not replace that evaluation, and the importance of remaining in the ED until their evaluation is complete.     Margarita Mail, PA-C 07/30/22 1300

## 2022-07-30 NOTE — ED Provider Notes (Signed)
[facility] Provider Note   CSN: [id number] Arrival date & time: 07/30/22  1215     History  Chief Complaint  Patient presents with   Chest Pain    Ian Baker is a 57 y.o. male.  HPI With history of prior intracranial hemorrhage presents with concern for palpitations.  He notes that these episodes been going on for about 3 weeks, without headache.  Notably, the patient did have a headache during an episode of prior palpitations during which she was found to have intracranial hemorrhage. He notes no neurocomplaints, no visual changes, voices concern about his palpitations, rapid heart rate.  No clear exertional changes.  He does not smoke, does not drink.    Home Medications Prior to Admission medications   Medication Sig Start Date End Date Taking? Authorizing Provider  Cromolyn Sodium (NASAL ALLERGY NA) Place 1 spray into the nose daily as needed. Patient not taking: Reported on 11/22/2021    [provider]  ibuprofen (ADVIL) 200 MG tablet Take 200 mg by mouth in the morning and at bedtime.    [provider]      Allergies    Patient has no known allergies.    Review of Systems   Review of Systems  All other systems reviewed and are negative.   Physical Exam Updated Vital Signs BP 125/77 (BP Location: Left Arm)   Pulse (!) 51   Temp 98.4 F (36.9 C) (Oral)   Resp 10   Ht 6' (1.829 m)   Wt 106.6 kg   SpO2 100%   BMI 31.87 kg/m  Physical Exam Vitals and nursing note reviewed.  Constitutional:      General: He is not in acute distress.    Appearance: He is well-developed.  HENT:     Head: Normocephalic and atraumatic.  Eyes:     Conjunctiva/sclera: Conjunctivae normal.  Cardiovascular:     Rate and Rhythm: Normal rate and regular rhythm.  Pulmonary:     Effort: Pulmonary effort is normal. No respiratory distress.     Breath sounds: No stridor.  Abdominal:     General: There is no  distension.  Skin:    General: Skin is warm and dry.  Neurological:     General: No focal deficit present.     Mental Status: He is alert and oriented to person, place, and time.     Cranial Nerves: No cranial nerve deficit.     Motor: No weakness.     ED Results / Procedures / Treatments   Labs (all labs ordered are listed, but only abnormal results are displayed) Labs Reviewed  BASIC METABOLIC PANEL - Abnormal; Notable for the following components:      Result Value   Calcium 8.6 (*)    All other components within normal limits  CBC  TSH  T4, FREE  TROPONIN I (HIGH SENSITIVITY)  TROPONIN I (HIGH SENSITIVITY)    EKG EKG Interpretation  Date/Time:  Wednesday July 30 2022 17:57:32 EST Ventricular Rate:  52 PR Interval:  146 QRS Duration: 94 QT Interval:  429 QTC Calculation: 399 R Axis:   36 Text Interpretation: Sinus rhythm unremarkable ecg Confirmed by Carmin Muskrat [phone]) on 07/30/2022 6:15:11 PM  Radiology CT Head Wo Contrast  Result Date: 07/30/2022 CLINICAL DATA:  Syncopal episode. EXAM: CT HEAD WITHOUT CONTRAST TECHNIQUE: Contiguous axial images were obtained from the base of the skull through the vertex without intravenous contrast. RADIATION DOSE REDUCTION: This  exam was performed according to the departmental dose-optimization program which includes automated exposure control, adjustment of the mA and/or kV according to patient size and/or use of iterative reconstruction technique. COMPARISON:  10/18/2021 FINDINGS: Brain: No evidence of intracranial hemorrhage, acute infarction, hydrocephalus, extra-axial collection, or mass lesion/mass effect. Vascular:  No hyperdense vessel or other acute findings. Skull: No evidence of fracture or other significant bone abnormality. Sinuses/Orbits:  No acute findings. Other: None. IMPRESSION: Negative noncontrast head CT. Electronically Signed   By: Marlaine Hind M.D.   On: 07/30/2022 18:27   DG Chest 2 View  Result Date:  07/30/2022 CLINICAL DATA:  Chest pain. EXAM: CHEST - 2 VIEW COMPARISON:  07/26/2015 FINDINGS: The heart size and mediastinal contours are within normal limits. There is no evidence of pulmonary edema, consolidation, pneumothorax, nodule or pleural fluid. The visualized skeletal structures are unremarkable. IMPRESSION: No active cardiopulmonary disease. Electronically Signed   By: Aletta Edouard M.D.   On: 07/30/2022 13:15    Procedures Procedures    Medications Ordered in ED Medications - No data to display  ED Course/ Medical Decision Making/ A&P                             Medical Decision Making Patient with history of prior intracranial hemorrhage, hyperlipidemia presents with palpitations, differential including ACS, arrhythmia, less likely intracranial hemorrhage or stroke, though this was unusually the case when he had prior episodes of palpitations. On monitor the patient has heart rate 60 sinus normal Pulse ox is 100% room air normal   Amount and/or Complexity of Data Reviewed External Data Reviewed: notes.    Details: ICH 1 year ago Labs: ordered. Decision-making details documented in ED Course. Radiology: ordered and independent interpretation performed. Decision-making details documented in ED Course. ECG/medicine tests: ordered and independent interpretation performed. Decision-making details documented in ED Course.   9:27 PM Patient in no distress, awake, alert, speaking clearly.  No arrhythmia on hours of monitoring here, no actual pain, 2 normal troponin, low suspicion for ACS given his relatively low risk profile.  No evidence for intracranial abnormality.  Patient comfortable with, appropriate for discharge with outpatient follow-up.        Final Clinical Impression(s) / ED Diagnoses Final diagnoses:  Palpitations    Rx / DC Orders ED Discharge Orders     None         Carmin Muskrat, MD 07/30/22 2128

## 2022-07-30 NOTE — Telephone Encounter (Addendum)
Patient is in [facility] ED being evaluated.

## 2022-07-30 NOTE — Telephone Encounter (Signed)
Chief Complaint: Rapid heart beat (not currently) Symptoms: Not currently but had chest tightness, mild SOB w/rapid heart rate Frequency: Onset 24-48 hours ago, most recent today after 9a Pertinent Negatives: Patient denies symptoms at this time Disposition: [x] ED /[] Urgent Care (no appt availability in office) / [] Appointment(In office/virtual)/ []  [facility]/ [] Home Care/ [] Refused Recommended Disposition /[] [facility]/ []  Follow-up with PCP Additional Notes: Patient called and says about 24-48 hours ago he has been experiencing a fast heart rate w/chest tightness and mild SOB that subsides when he sits and rests. It happened at work yesterday, lasting he says a few minutes and resolves it self after sitting down and resting. He says he was fine the rest of yesterday and during the night, but he woke up around 9a and shortly after the same thing happened today. He sat down and rested, then took a shower. He's calling for an appointment. Advised he will need ED evaluation due to the fact this keeps happening and the CP, SOB he has with it is concerning and ED would be the best place for that evaluation. He verbalized understanding and says he will take a cab and not drive.     Reason for Disposition  [1] Heart beating very rapidly (e.g., > 140 / minute) AND [2] not present now  (Exception: During exercise.)  Answer Assessment - Initial Assessment Questions 1. DESCRIPTION: "Please describe your heart rate or heartbeat that you are having" (e.g., fast/slow, regular/irregular, skipped or extra beats, "palpitations")     Fast heart beat, then slows when rest 2. ONSET: "When did it start?" (Minutes, hours or days)      Onset past 24-48 hours 3. DURATION: "How long does it last" (e.g., seconds, minutes, hours)     A few minutes 4. PATTERN "Does it come and go, or has it been constant since it started?"  "Does it get worse with exertion?"   "Are you feeling it now?"     Comes  and goes 5. TAP: "Using your hand, can you tap out what you are feeling on a chair or table in front of you, so that I can hear?" (Note: not all patients can do this)       N/A 6. HEART RATE: "Can you tell me your heart rate?" "How many beats in 15 seconds?"  (Note: not all patients can do this)       N/A 7. RECURRENT SYMPTOM: "Have you ever had this before?" If Yes, ask: "When was the last time?" and "What happened that time?"      Yes 8. CAUSE: "What do you think is causing the palpitations?"     It seems to happen after eating  9. CARDIAC HISTORY: "Do you have any history of heart disease?" (e.g., heart attack, angina, bypass surgery, angioplasty, arrhythmia)      Stroke last year 10. OTHER SYMPTOMS: "Do you have any other symptoms?" (e.g., dizziness, chest pain, sweating, difficulty breathing)      Chest tightness, mild SOB (not at this time, but when it happened)  Protocols used: Heart Rate and Heartbeat Questions-A-AH

## 2022-08-12 NOTE — Progress Notes (Deleted)
Cardiology Office Note:   Date:  08/12/2022  NAME:  Ian Baker    MRN: [id number] DOB:  15-Sep-1965   PCP:  Ladell Pier, MD  Cardiologist:  None  Electrophysiologist:  None   Referring MD: Ladell Pier, MD   No chief complaint on file. ***  History of Present Illness:   Ian Baker is a 57 y.o. male with a hx of ICH, HTN, HLD who is being seen today for the evaluation of chest pain/palpitations at the request of Ladell Pier, MD. Seen in ER 07/30/2022 for CP/palpitations who presents for follow-up.   Troponin negative x 2. EKG normal. TSH 1.35  Problem List HTN ICH -09/2021  Past Medical History: Past Medical History:  Diagnosis Date   Arthritis    Chest pain    Normal coronary arteries 07/14/11 with normal LV function by echo at that time, negative for PE by CT angio 07/13/11   Chest pain    Hepatic steatosis    Hyperglycemia    A1C 5.8   Marijuana abuse    smokes about 1x/month   Overweight(278.02)    Thrombocytopenia ([place])    Noted 1/13 hospitalization - to f/u with PCP   Tobacco abuse    1 thin cigar every few months    Past Surgical History: Past Surgical History:  Procedure Laterality Date   APPENDECTOMY     20005   LEFT HEART CATHETERIZATION WITH CORONARY ANGIOGRAM N/A 07/14/2011   Procedure: LEFT HEART CATHETERIZATION WITH CORONARY ANGIOGRAM;  Surgeon: Hillary Bow, MD;  Location: [facility] CATH LAB;  Service: Cardiovascular;  Laterality: N/A;    Current Medications: No outpatient medications have been marked as taking for the 08/15/22 encounter (Appointment) with O'Neal, Cassie Freer, MD.     Allergies:    Patient has no known allergies.   Social History: Social History   Socioeconomic History   Marital status: Single    Spouse name: Not on file   Number of children: 2   Years of education: Not on file   Highest education level: Associate degree: occupational, Hotel manager, or vocational program  Occupational History    Occupation: Cab Education administrator: [employer]  Tobacco Use   Smoking status: Never   Smokeless tobacco: Never   Tobacco comments:    smokes 1 cigar every month  Vaping Use   Vaping Use: Never used  Substance and Sexual Activity   Alcohol use: Yes    Alcohol/week: 2.0 standard drinks of alcohol    Types: 2 Cans of beer per week    Comment: has a few drinks/month   Drug use: Not Currently    Types: Marijuana    Comment: uses marijuana about 1x/month   Sexual activity: Yes  Other Topics Concern   Not on file  Social History Narrative   Not on file   Social Determinants of Health   Financial Resource Strain: Not on file  Food Insecurity: Not on file  Transportation Needs: Not on file  Physical Activity: Not on file  Stress: Not on file  Social Connections: Not on file     Family History: The patient's ***family history includes Coronary artery disease in his mother; Diabetes in his mother; Prostate cancer in his maternal uncle.  ROS:   All other ROS reviewed and negative. Pertinent positives noted in the HPI.     EKGs/Labs/Other Studies Reviewed:   The following studies were personally reviewed by me today:  EKG:  EKG is ***  ordered today.  The ekg ordered today demonstrates ***, and was personally reviewed by me.   TTE 10/18/2021  1. Left ventricular ejection fraction, by estimation, is 60 to 65%. The  left ventricle has normal function. The left ventricle has no regional  wall motion abnormalities. Left ventricular diastolic parameters are  consistent with Grade I diastolic  dysfunction (impaired relaxation).   2. Right ventricular systolic function is normal. The right ventricular  size is normal.   3. The mitral valve is normal in structure. No evidence of mitral valve  regurgitation. No evidence of mitral stenosis.   4. The aortic valve is normal in structure. Aortic valve regurgitation is  not visualized. No aortic stenosis is present.   5. The inferior  vena cava is normal in size with greater than 50%  respiratory variability, suggesting right atrial pressure of 3 mmHg.   Recent Labs: 10/18/2021: ALT 19 07/30/2022: BUN 12; Creatinine, Ser 1.06; Hemoglobin 15.5; Platelets 152; Potassium 3.9; Sodium 140; TSH 1.356   Recent Lipid Panel    Component Value Date/Time   CHOL 157 10/19/2021 0943   CHOL 170 02/07/2020 0950   TRIG 74 10/19/2021 0943   HDL 45 10/19/2021 0943   HDL 47 02/07/2020 0950   CHOLHDL 3.5 10/19/2021 0943   VLDL 15 10/19/2021 0943   LDLCALC 97 10/19/2021 0943   LDLCALC 108 (H) 02/07/2020 0950    Physical Exam:   VS:  There were no vitals taken for this visit.   Wt Readings from Last 3 Encounters:  07/30/22 235 lb (106.6 kg)  11/22/21 235 lb (106.6 kg)  10/18/21 250 lb (113.4 kg)    General: Well nourished, well developed, in no acute distress Head: Atraumatic, normal size  Eyes: PEERLA, EOMI  Neck: Supple, no JVD Endocrine: No thryomegaly Cardiac: Normal S1, S2; RRR; no murmurs, rubs, or gallops Lungs: Clear to auscultation bilaterally, no wheezing, rhonchi or rales  Abd: Soft, nontender, no hepatomegaly  Ext: No edema, pulses 2+ Musculoskeletal: No deformities, BUE and BLE strength normal and equal Skin: Warm and dry, no rashes   Neuro: Alert and oriented to person, place, time, and situation, CNII-XII grossly intact, no focal deficits  Psych: Normal mood and affect   ASSESSMENT:   Ian Baker is a 57 y.o. male who presents for the following: No diagnosis found.  PLAN:   There are no diagnoses linked to this encounter.  {Are you ordering a CV Procedure (e.g. stress test, cath, DCCV, TEE, etc)?   Press F2        :[id number]  Disposition: No follow-ups on file.  Medication Adjustments/Labs and Tests Ordered: Current medicines are reviewed at length with the patient today.  Concerns regarding medicines are outlined above.  No orders of the defined types were placed in this encounter.  No orders  of the defined types were placed in this encounter.   There are no Patient Instructions on file for this visit.   Time Spent with Patient: I have spent a total of *** minutes with patient reviewing hospital notes, telemetry, EKGs, labs and examining the patient as well as establishing an assessment and plan that was discussed with the patient.  > 50% of time was spent in direct patient care.  Signed, Addison Naegeli. Audie Box, MD, [facility]  [address] [place], [place] [address] [phone]  08/12/2022 12:49 PM

## 2022-08-15 ENCOUNTER — Ambulatory Visit: Payer: Self-pay | Attending: Cardiovascular Disease | Admitting: Cardiovascular Disease

## 2022-08-15 DIAGNOSIS — R072 Precordial pain: Secondary | ICD-10-CM

## 2022-08-15 DIAGNOSIS — R002 Palpitations: Secondary | ICD-10-CM

## 2022-08-27 ENCOUNTER — Encounter: Payer: Self-pay | Admitting: General Practice

## 2022-08-29 ENCOUNTER — Ambulatory Visit: Payer: Self-pay | Attending: Internal Medicine | Admitting: Internal Medicine

## 2022-08-29 ENCOUNTER — Encounter: Payer: Self-pay | Admitting: Internal Medicine

## 2022-08-29 VITALS — BP 108/70 | HR 62 | Temp 98.2°F | Ht 72.0 in | Wt 227.0 lb

## 2022-08-29 DIAGNOSIS — Z23 Encounter for immunization: Secondary | ICD-10-CM

## 2022-08-29 DIAGNOSIS — R002 Palpitations: Secondary | ICD-10-CM

## 2022-08-29 DIAGNOSIS — Z2821 Immunization not carried out because of patient refusal: Secondary | ICD-10-CM

## 2022-08-29 DIAGNOSIS — E66811 Obesity, class 1: Secondary | ICD-10-CM

## 2022-08-29 DIAGNOSIS — J302 Other seasonal allergic rhinitis: Secondary | ICD-10-CM

## 2022-08-29 DIAGNOSIS — R7303 Prediabetes: Secondary | ICD-10-CM

## 2022-08-29 DIAGNOSIS — R351 Nocturia: Secondary | ICD-10-CM

## 2022-08-29 DIAGNOSIS — E669 Obesity, unspecified: Secondary | ICD-10-CM

## 2022-08-29 DIAGNOSIS — N401 Enlarged prostate with lower urinary tract symptoms: Secondary | ICD-10-CM

## 2022-08-29 DIAGNOSIS — Z0000 Encounter for general adult medical examination without abnormal findings: Secondary | ICD-10-CM

## 2022-08-29 NOTE — Patient Instructions (Signed)

## 2022-08-29 NOTE — Progress Notes (Signed)
Patient ID: Ian Baker, male    DOB: 08-28-1965  MRN: [id number]  CC: Annual Exam (Physical /Pt requesting for blood work - cholesterol, A1C, PSA/Yes to flu vax. Yes to shingles vax)   Subjective: Ian Baker is a 57 y.o. male who presents for annual exam His concerns today include:  Patient with history of BPH, obesity, prediabetes, colon polyps, HL, ICH LT cerebral peduncle..   Obesity/prediabetes: Weight is down 8 pounds from last visit in June of last year.  Reports he is doing well with eating habits.  He usually has a fruit smoothie in the mornings.  Drinking more water and more fruits and veggies.  He rides 10 miles per week back and forth to work.  In ER 1 month ago with chest pains and palpitations.  Workup was negative.  Patient states he figured out that he was drinking too much coffee and cut back.  The palpitations have since stopped.  Patient Active Problem List   Diagnosis Date Noted   ICH (intracerebral hemorrhage) ([facility]) 10/18/2021   Prediabetes 02/08/2020   Hyperlipidemia with target LDL less than 100 02/08/2020   Benign prostatic hyperplasia with urinary frequency 02/07/2020   Class 1 obesity due to excess calories without serious comorbidity with body mass index (BMI) of 34.0 to 34.9 in adult 02/07/2020   History of colon polyps 02/07/2020   Chest pain      Current Outpatient Medications on File Prior to Visit  Medication Sig Dispense Refill   ibuprofen (ADVIL) 200 MG tablet Take 200 mg by mouth in the morning and at bedtime.     Cromolyn Sodium (NASAL ALLERGY NA) Place 1 spray into the nose daily as needed. (Patient not taking: Reported on 11/22/2021)     No current facility-administered medications on file prior to visit.    No Known Allergies  Social History   Socioeconomic History   Marital status: Single    Spouse name: Not on file   Number of children: 2   Years of education: Not on file   Highest education level: Associate degree:  occupational, Hotel manager, or vocational program  Occupational History   Occupation: Cab Education administrator: [employer]  Tobacco Use   Smoking status: Never   Smokeless tobacco: Never   Tobacco comments:    smokes 1 cigar every month  Vaping Use   Vaping Use: Never used  Substance and Sexual Activity   Alcohol use: Yes    Alcohol/week: 2.0 standard drinks of alcohol    Types: 2 Cans of beer per week    Comment: has a few drinks/month   Drug use: Not Currently    Types: Marijuana    Comment: uses marijuana about 1x/month   Sexual activity: Yes  Other Topics Concern   Not on file  Social History Narrative   Not on file   Social Determinants of Health   Financial Resource Strain: Not on file  Food Insecurity: Not on file  Transportation Needs: Not on file  Physical Activity: Not on file  Stress: Not on file  Social Connections: Not on file  Intimate Partner Violence: Not on file    Family History  Problem Relation Age of Onset   Diabetes Mother    Coronary artery disease Mother        onset 65's, currently 96   Prostate cancer Maternal Uncle     Past Surgical History:  Procedure Laterality Date   APPENDECTOMY     20005  LEFT HEART CATHETERIZATION WITH CORONARY ANGIOGRAM N/A 07/14/2011   Procedure: LEFT HEART CATHETERIZATION WITH CORONARY ANGIOGRAM;  Surgeon: Hillary Bow, MD;  Location: [facility] CATH LAB;  Service: Cardiovascular;  Laterality: N/A;    ROS: Review of Systems  Constitutional:  Negative for activity change.  HENT:  Negative for dental problem and sore throat.        Has seasonal allergies.  Asking advice of what can be purchased over-the-counter when he needs something for allergy symptoms comes spring.  Eyes:        Wears reading glasses.  Last eye exam was 4 years ago.  Currently uninsured.  Respiratory:  Negative for cough and shortness of breath.   Cardiovascular:  Negative for chest pain and leg swelling.  Gastrointestinal:        Moving  his bowels okay.  No blood in the stools.  Genitourinary:        Still wakes up several times at nights to urinate but he attributes this more to drinking a lot of water during the day and in the evenings.  Urine stream is a little weak.  He does not want to go back on Flomax.     PHYSICAL EXAM: BP 108/70 (BP Location: Left Arm, Patient Position: Sitting, Cuff Size: Normal)   Pulse 62   Temp 98.2 F (36.8 C) (Oral)   Ht 6' (1.829 m)   Wt 227 lb (103 kg)   SpO2 99%   BMI 30.79 kg/m   Wt Readings from Last 3 Encounters:  08/29/22 227 lb (103 kg)  07/30/22 235 lb (106.6 kg)  11/22/21 235 lb (106.6 kg)    Physical Exam   General appearance - alert, well appearing, middle-aged African-American male and in no distress Mental status - normal mood, behavior, speech, dress, motor activity, and thought processes Eyes - pupils equal and reactive, extraocular eye movements intact Ears - bilateral TM's and external ear canals normal Nose - normal and patent, no erythema, discharge or polyps Mouth - mucous membranes moist, pharynx normal without lesions Neck - supple, no significant adenopathy Lymphatics - no palpable lymphadenopathy, no hepatosplenomegaly Chest - clear to auscultation, no wheezes, rales or rhonchi, symmetric air entry Heart - normal rate, regular rhythm, normal S1, S2, no murmurs, rubs, clicks or gallops Abdomen - soft, nontender, nondistended, no masses or organomegaly Neurological - cranial nerves II through XII intact, motor and sensory grossly normal bilaterally Musculoskeletal - no joint tenderness, deformity or swelling Extremities - peripheral pulses normal, no pedal edema, no clubbing or cyanosis     Latest Ref Rng & Units 07/30/2022   12:42 PM 10/19/2021    2:11 AM 10/18/2021   10:34 AM  CMP  Glucose 70 - 99 mg/dL 97  114  112   BUN 6 - 20 mg/dL '12  7  12   '$ Creatinine 0.61 - 1.24 mg/dL 1.06  1.00  1.00   Sodium 135 - 145 mmol/L 140  141  142   Potassium 3.5  - 5.1 mmol/L 3.9  3.7  3.9   Chloride 98 - 111 mmol/L 107  108  108   CO2 22 - 32 mmol/L 25  27    Calcium 8.9 - 10.3 mg/dL 8.6  9.1     Lipid Panel     Component Value Date/Time   CHOL 157 10/19/2021 0943   CHOL 170 02/07/2020 0950   TRIG 74 10/19/2021 0943   HDL 45 10/19/2021 0943   HDL 47 02/07/2020 0950  CHOLHDL 3.5 10/19/2021 0943   VLDL 15 10/19/2021 0943   LDLCALC 97 10/19/2021 0943   LDLCALC 108 (H) 02/07/2020 0950    CBC    Component Value Date/Time   WBC 4.7 07/30/2022 1242   RBC 5.16 07/30/2022 1242   HGB 15.5 07/30/2022 1242   HGB 14.9 02/07/2020 0950   HCT 45.0 07/30/2022 1242   HCT 46.1 02/07/2020 0950   PLT 152 07/30/2022 1242   PLT 170 02/07/2020 0950   MCV 87.2 07/30/2022 1242   MCV 87 02/07/2020 0950   MCH 30.0 07/30/2022 1242   MCHC 34.4 07/30/2022 1242   RDW 12.5 07/30/2022 1242   RDW 13.2 02/07/2020 0950   LYMPHSABS 3.1 10/18/2021 1020   MONOABS 0.4 10/18/2021 1020   EOSABS 0.2 10/18/2021 1020   BASOSABS 0.0 10/18/2021 1020    ASSESSMENT AND PLAN: 1. Annual physical exam   2. Obesity (BMI 30.0-34.9) Commended him on weight loss. Commended him on healthy eating habits.  Encouraged him to keep up the good works.  Continue regular exercise - Lipid panel - Hemoglobin A1c - Hepatic Function Panel  3. Prediabetes See #2 above - Lipid panel - Hemoglobin A1c - Hepatic Function Panel  4. Palpitations Resolved with decrease caffeine intake.  Advised to let me know if he starts having palpitations again.  Thyroid level was checked in the emergency room and was normal.  5. Seasonal allergies Advise he can use Claritin and Flonase nasal spray both of which can be purchased over-the-counter and used as needed  6. Benign prostatic hyperplasia with nocturia Patient declines being started on Flomax again.  Recommends that he tries to cut off drinking fluids after 7 or 8 PM at nights  7. Influenza vaccination declined Recommended.  Patient  declined.  8. Need for shingles vaccine Patient agreeable to receiving his second Shingrix vaccine today.  It was given.     Patient was given the opportunity to ask questions.  Patient verbalized understanding of the plan and was able to repeat key elements of the plan.   This documentation was completed using Radio producer.  Any transcriptional errors are unintentional.  No orders of the defined types were placed in this encounter.    Requested Prescriptions    No prescriptions requested or ordered in this encounter    No follow-ups on file.  Karle Plumber, MD, FACP

## 2022-08-30 LAB — HEMOGLOBIN A1C
Est. average glucose Bld gHb Est-mCnc: 126 mg/dL
Hgb A1c MFr Bld: 6 % — ABNORMAL HIGH (ref 4.8–5.6)

## 2022-08-30 LAB — HEPATIC FUNCTION PANEL
ALT: 19 IU/L (ref 0–44)
AST: 20 IU/L (ref 0–40)
Albumin: 4.4 g/dL (ref 3.8–4.9)
Alkaline Phosphatase: 49 IU/L (ref 44–121)
Bilirubin Total: 0.6 mg/dL (ref 0.0–1.2)
Bilirubin, Direct: 0.14 mg/dL (ref 0.00–0.40)
Total Protein: 6.9 g/dL (ref 6.0–8.5)

## 2022-08-30 LAB — LIPID PANEL
Chol/HDL Ratio: 2.5 ratio (ref 0.0–5.0)
Cholesterol, Total: 150 mg/dL (ref 100–199)
HDL: 61 mg/dL (ref >39)
LDL Chol Calc (NIH): 71 mg/dL (ref 0–99)
Triglycerides: 99 mg/dL (ref 0–149)
VLDL Cholesterol Cal: 18 mg/dL (ref 5–40)

## 2023-09-18 ENCOUNTER — Ambulatory Visit: Payer: Self-pay | Attending: Internal Medicine | Admitting: Internal Medicine

## 2023-09-18 ENCOUNTER — Encounter: Payer: Self-pay | Admitting: Internal Medicine

## 2023-09-18 VITALS — BP 115/65 | HR 67 | Temp 98.5°F | Ht 72.0 in | Wt 230.0 lb

## 2023-09-18 DIAGNOSIS — Z0000 Encounter for general adult medical examination without abnormal findings: Secondary | ICD-10-CM

## 2023-09-18 DIAGNOSIS — E66811 Obesity, class 1: Secondary | ICD-10-CM

## 2023-09-18 DIAGNOSIS — E669 Obesity, unspecified: Secondary | ICD-10-CM

## 2023-09-18 DIAGNOSIS — Z1211 Encounter for screening for malignant neoplasm of colon: Secondary | ICD-10-CM

## 2023-09-18 DIAGNOSIS — R7303 Prediabetes: Secondary | ICD-10-CM

## 2023-09-18 DIAGNOSIS — Z125 Encounter for screening for malignant neoplasm of prostate: Secondary | ICD-10-CM

## 2023-09-18 DIAGNOSIS — Z6831 Body mass index (BMI) 31.0-31.9, adult: Secondary | ICD-10-CM

## 2023-09-18 LAB — GLUCOSE, POCT (MANUAL RESULT ENTRY): POC Glucose: 143 mg/dL — AB (ref 70–99)

## 2023-09-18 LAB — POCT GLYCOSYLATED HEMOGLOBIN (HGB A1C): HbA1c, POC (prediabetic range): 5.8 % (ref 5.7–6.4)

## 2023-09-18 NOTE — Progress Notes (Signed)
 Patient ID: Ian Baker, male    DOB: 12-23-1965  MRN: [id number]  CC: Annual Exam (Physical. [facility] bloodwork -lipid panel, psa & other routine work/Already received flu vax)   Subjective: Ian Baker is a 58 y.o. male who presents for Physical Exam His concerns today include:  Patient with history of BPH, obesity, prediabetes, colon polyps, HL, ICH LT cerebral peduncle..   Discussed the use of AI scribe software for clinical note transcription with the patient, who gave verbal consent to proceed.  History of Present Illness   Ian Baker is a 58 year old male who presents for an annual physical exam.  He is here for his annual physical examination and has agreed to have baseline blood tests including cholesterol, kidney, liver function tests, and prostate level checked. He is also due for colon cancer screening and prefers to do the stool sample test again, as he did previously.  He has a family history of diabetes and high blood pressure. He has been in the prediabetes range in the past, with a previous A1c of 5.3. He has gained about 15 pounds over the past two years, currently weighing 230 pounds, up from 212-213 pounds a year ago. He attributes some of this weight gain to emotional eating following a job disappointment.  He describes his eating habits as generally good four days a week, with occasional fast food consumption. He aims to improve his diet to eating well five to six days a week. He incorporates beets, spinach, and sweet potatoes into his diet and drinks plenty of water.  He exercises by riding his bike ten miles a week, spread over five days, and performs pushups, sit-ups, and weight exercises for toning. He does not smoke and drinks alcohol very rarely, mostly on his birthday or occasionally during football season, limiting himself to no more than two beers a week. He denies any use of street drugs.  No issues with hearing, though one ear is better  than the other. He experiences blurry vision and low-grade headaches when his blood pressure spikes, often due to high salt intake. No chronic cough, shortness of breath, chest pain, or sore throat. Normal bowel and urinary function without blood in the urine or stool.  He takes Claritin for regular allergies as needed. He mentions that excessive coffee consumption can cause kidney discomfort, which resolves when he reduces intake.       Patient Active Problem List   Diagnosis Date Noted   ICH (intracerebral hemorrhage) (HCC) 10/18/2021   Prediabetes 02/08/2020   Hyperlipidemia with target LDL less than 100 02/08/2020   Benign prostatic hyperplasia with urinary frequency 02/07/2020   Class 1 obesity due to excess calories without serious comorbidity with body mass index (BMI) of 34.0 to 34.9 in adult 02/07/2020   History of colon polyps 02/07/2020   Chest pain      Current Outpatient Medications on File Prior to Visit  Medication Sig Dispense Refill   Cromolyn Sodium (NASAL ALLERGY NA) Place 1 spray into the nose daily as needed.     ibuprofen (ADVIL) 200 MG tablet Take 200 mg by mouth in the morning and at bedtime.     No current facility-administered medications on file prior to visit.    No Known Allergies  Social History   Socioeconomic History   Marital status: Single    Spouse name: Not on file   Number of children: 2   Years of education: Not on file   Highest education  level: Associate degree: occupational, Scientist, product/process development, or vocational program  Occupational History   Occupation: Cab Air traffic controller: [employer]  Tobacco Use   Smoking status: Never   Smokeless tobacco: Never   Tobacco comments:    smokes 1 cigar every month  Vaping Use   Vaping status: Never Used  Substance and Sexual Activity   Alcohol use: Yes    Alcohol/week: 2.0 standard drinks of alcohol    Types: 2 Cans of beer per week    Comment: has a few drinks/month   Drug use: Not Currently     Types: Marijuana    Comment: uses marijuana about 1x/month   Sexual activity: Yes  Other Topics Concern   Not on file  Social History Narrative   Not on file   Social Drivers of Health   Financial Resource Strain: Low Risk  (09/18/2023)   Overall Financial Resource Strain (CARDIA)    Difficulty of Paying Living Expenses: Not hard at all  Food Insecurity: No Food Insecurity (09/18/2023)   Hunger Vital Sign    Worried About Running Out of Food in the Last Year: Never true    Ran Out of Food in the Last Year: Never true  Transportation Needs: No Transportation Needs (09/18/2023)   PRAPARE - Administrator, Civil Service (Medical): No    Lack of Transportation (Non-Medical): No  Physical Activity: Insufficiently Active (09/18/2023)   Exercise Vital Sign    Days of Exercise per Week: 4 days    Minutes of Exercise per Session: 30 min  Stress: No Stress Concern Present (09/18/2023)   [employer] of Occupational Health - Occupational Stress Questionnaire    Feeling of Stress : Only a little  Social Connections: Moderately Isolated (09/18/2023)   Social Connection and Isolation Panel [NHANES]    Frequency of Communication with Friends and Family: Twice a week    Frequency of Social Gatherings with Friends and Family: Twice a week    Attends Religious Services: Never    Database administrator or Organizations: No    Attends Engineer, structural: 1 to 4 times per year    Marital Status: Divorced  Catering manager Violence: Not At Risk (09/18/2023)   Humiliation, Afraid, Rape, and Kick questionnaire    Fear of Current or Ex-Partner: No    Emotionally Abused: No    Physically Abused: No    Sexually Abused: No    Family History  Problem Relation Age of Onset   Diabetes Mother    Coronary artery disease Mother        onset 48's, currently 70   Prostate cancer Maternal Uncle     Past Surgical History:  Procedure Laterality Date   APPENDECTOMY     20005    LEFT HEART CATHETERIZATION WITH CORONARY ANGIOGRAM N/A 07/14/2011   Procedure: LEFT HEART CATHETERIZATION WITH CORONARY ANGIOGRAM;  Surgeon: Herby Abraham, MD;  Location: [facility] CATH LAB;  Service: Cardiovascular;  Laterality: N/A;    ROS: Review of Systems  HENT:  Negative for hearing loss and sore throat.   Respiratory:  Negative for cough and shortness of breath.   Cardiovascular:  Negative for chest pain.  Gastrointestinal:  Negative for abdominal pain and blood in stool.  Genitourinary:  Negative for hematuria.   Negative except as stated above  PHYSICAL EXAM: BP 115/65 (BP Location: Left Arm, Patient Position: Sitting, Cuff Size: Normal)   Pulse 67   Temp 98.5 F (36.9 C) (  Oral)   Ht 6' (1.829 m)   Wt 230 lb (104.3 kg)   SpO2 97%   BMI 31.19 kg/m   Wt Readings from Last 3 Encounters:  09/18/23 230 lb (104.3 kg)  08/29/22 227 lb (103 kg)  07/30/22 235 lb (106.6 kg)    Physical Exam   General appearance - alert, well appearing, older African-American male and in no distress Mental status - normal mood, behavior, speech, dress, motor activity, and thought processes Eyes - pupils equal and reactive, extraocular eye movements intact Ears - bilateral TM's and external ear canals normal Nose - normal and patent, no erythema, discharge or polyps Mouth - mucous membranes moist, pharynx normal without lesions Neck - supple, no significant adenopathy Lymphatics - no palpable lymphadenopathy, no hepatosplenomegaly Chest - clear to auscultation, no wheezes, rales or rhonchi, symmetric air entry Heart - normal rate, regular rhythm, normal S1, S2, no murmurs, rubs, clicks or gallops Abdomen - soft, nontender, nondistended, no masses or organomegaly Neurological - alert, oriented, normal speech, no focal findings or movement disorder noted Musculoskeletal - no joint tenderness, deformity or swelling Extremities - peripheral pulses normal, no pedal edema, no clubbing or  cyanosis  Results for orders placed or performed in visit on 09/18/23  POCT glucose (manual entry)   Collection Time: 09/18/23  3:42 PM  Result Value Ref Range   POC Glucose 143 (A) 70 - 99 mg/dl  POCT glycosylated hemoglobin (Hb A1C)   Collection Time: 09/18/23  4:13 PM  Result Value Ref Range   Hemoglobin A1C     HbA1c POC (<> result, manual entry)     HbA1c, POC (prediabetic range) 5.8 5.7 - 6.4 %   HbA1c, POC (controlled diabetic range)        09/18/2023    3:43 PM 08/29/2022    2:58 PM 11/22/2021    2:34 PM  Depression screen PHQ 2/9  Decreased Interest 0 0 0  Down, Depressed, Hopeless 0 0 0  PHQ - 2 Score 0 0 0  Altered sleeping 0 1 0  Tired, decreased energy 0 1 0  Change in appetite 0 0 0  Feeling bad or failure about yourself  0 0 0  Trouble concentrating 0 0 0  Moving slowly or fidgety/restless 0 0 0  Suicidal thoughts 0 0 0  PHQ-9 Score 0 2 0  Difficult doing work/chores Not difficult at all         Latest Ref Rng & Units 08/29/2022    3:59 PM 07/30/2022   12:42 PM 10/19/2021    2:11 AM  CMP  Glucose 70 - 99 mg/dL  97  782   BUN 6 - 20 mg/dL  12  7   Creatinine 9.56 - 1.24 mg/dL  2.13  0.86   Sodium 578 - 145 mmol/L  140  141   Potassium 3.5 - 5.1 mmol/L  3.9  3.7   Chloride 98 - 111 mmol/L  107  108   CO2 22 - 32 mmol/L  25  27   Calcium 8.9 - 10.3 mg/dL  8.6  9.1   Total Protein 6.0 - 8.5 g/dL 6.9     Total Bilirubin 0.0 - 1.2 mg/dL 0.6     Alkaline Phos 44 - 121 IU/L 49     AST 0 - 40 IU/L 20     ALT 0 - 44 IU/L 19      Lipid Panel     Component Value Date/Time   CHOL 150  08/29/2022 1559   TRIG 99 08/29/2022 1559   HDL 61 08/29/2022 1559   CHOLHDL 2.5 08/29/2022 1559   CHOLHDL 3.5 10/19/2021 0943   VLDL 15 10/19/2021 0943   LDLCALC 71 08/29/2022 1559    CBC    Component Value Date/Time   WBC 4.7 07/30/2022 1242   RBC 5.16 07/30/2022 1242   HGB 15.5 07/30/2022 1242   HGB 14.9 02/07/2020 0950   HCT 45.0 07/30/2022 1242   HCT 46.1 02/07/2020  0950   PLT 152 07/30/2022 1242   PLT 170 02/07/2020 0950   MCV 87.2 07/30/2022 1242   MCV 87 02/07/2020 0950   MCH 30.0 07/30/2022 1242   MCHC 34.4 07/30/2022 1242   RDW 12.5 07/30/2022 1242   RDW 13.2 02/07/2020 0950   LYMPHSABS 3.1 10/18/2021 1020   MONOABS 0.4 10/18/2021 1020   EOSABS 0.2 10/18/2021 1020   BASOSABS 0.0 10/18/2021 1020    ASSESSMENT AND PLAN: 1. Annual physical exam Commended him on trying to eat healthy.  As a compromise, encouraged him to try to eat healthy throughout the week and splurge 1 day on the weekend.  Continue regular exercise.  2. Obesity (BMI 30.0-34.9) See #1 above. - CBC - Comprehensive metabolic panel with GFR - Lipid panel  3. Prediabetes (Primary) See #1 above. - POCT glycosylated hemoglobin (Hb A1C) - POCT glucose (manual entry)  4. Screening for colon cancer - Fecal occult blood, imunochemical([facility])  5. Prostate cancer screening - PSA   Patient was given the opportunity to ask questions.  Patient verbalized understanding of the plan and was able to repeat key elements of the plan.   This documentation was completed using Paediatric nurse.  Any transcriptional errors are unintentional.  Orders Placed This Encounter  Procedures   Fecal occult blood, imunochemical([facility])   CBC   Comprehensive metabolic panel with GFR   Lipid panel   PSA   POCT glycosylated hemoglobin (Hb A1C)   POCT glucose (manual entry)     Requested Prescriptions    No prescriptions requested or ordered in this encounter    No follow-ups on file.  Jonah Blue, MD, FACP

## 2023-09-18 NOTE — Patient Instructions (Signed)

## 2023-09-19 LAB — COMPREHENSIVE METABOLIC PANEL WITH GFR
ALT: 16 IU/L (ref 0–44)
AST: 22 IU/L (ref 0–40)
Albumin: 4.2 g/dL (ref 3.8–4.9)
Alkaline Phosphatase: 52 IU/L (ref 44–121)
BUN/Creatinine Ratio: 10 (ref 9–20)
BUN: 11 mg/dL (ref 6–24)
Bilirubin Total: 0.6 mg/dL (ref 0.0–1.2)
CO2: 24 mmol/L (ref 20–29)
Calcium: 9.2 mg/dL (ref 8.7–10.2)
Chloride: 106 mmol/L (ref 96–106)
Creatinine, Ser: 1.05 mg/dL (ref 0.76–1.27)
Globulin, Total: 2.5 g/dL (ref 1.5–4.5)
Glucose: 78 mg/dL (ref 70–99)
Potassium: 4.1 mmol/L (ref 3.5–5.2)
Sodium: 143 mmol/L (ref 134–144)
Total Protein: 6.7 g/dL (ref 6.0–8.5)
eGFR: 82 mL/min/{1.73_m2} (ref >59)

## 2023-09-19 LAB — LIPID PANEL
Chol/HDL Ratio: 2.8 ratio (ref 0.0–5.0)
Cholesterol, Total: 143 mg/dL (ref 100–199)
HDL: 51 mg/dL (ref >39)
LDL Chol Calc (NIH): 71 mg/dL (ref 0–99)
Triglycerides: 114 mg/dL (ref 0–149)
VLDL Cholesterol Cal: 21 mg/dL (ref 5–40)

## 2023-09-19 LAB — CBC
Hematocrit: 45.7 % (ref 37.5–51.0)
Hemoglobin: 15.1 g/dL (ref 13.0–17.7)
MCH: 29.9 pg (ref 26.6–33.0)
MCHC: 33 g/dL (ref 31.5–35.7)
MCV: 91 fL (ref 79–97)
Platelets: 175 10*3/uL (ref 150–450)
RBC: 5.05 x10E6/uL (ref 4.14–5.80)
RDW: 13.8 % (ref 11.6–15.4)
WBC: 5.1 10*3/uL (ref 3.4–10.8)

## 2023-09-19 LAB — PSA: Prostate Specific Ag, Serum: 2.2 ng/mL (ref 0.0–4.0)

## 2024-02-16 IMAGING — MR MR HEAD WO/W CM
11 of 13 series · 38 of 48 positions shown · IV contrast (Gadavist)
Comparison: Head CT and CTA 10/18/2021.

CLINICAL DATA: 55-year-old male with parenchymal versus
subarachnoid hemorrhage along the left cerebellar peduncle on head
CT after presenting with dizziness and abnormal gait. Unrevealing
CTA.

EXAM:
MRI HEAD WITHOUT AND WITH CONTRAST
TECHNIQUE: Multiplanar, multiecho pulse sequences of the brain and surrounding
structures were obtained without and with intravenous contrast.
CONTRAST:  10mL GADAVIST GADOBUTROL 1 MMOL/ML IV SOLN

[Series 5: DWI · axial · 3.0mm · 0.88mm/px · z∈[-105,+42]mm · 7 of 100 slices shown (1 of 4)]
[im 1/100]
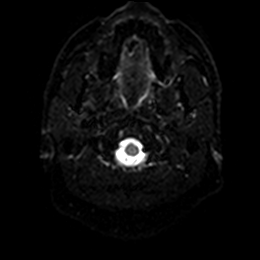
[im 17/100]
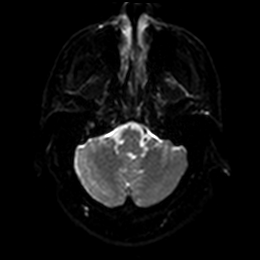
[im 34/100]
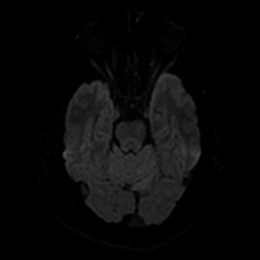
[im 50/100]
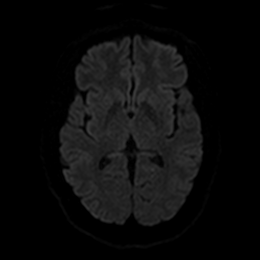
[im 67/100]
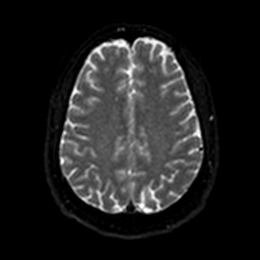
[im 83/100]
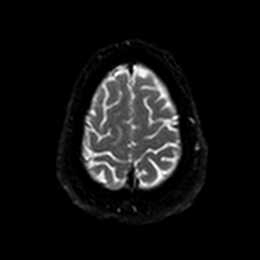
[im 100/100]
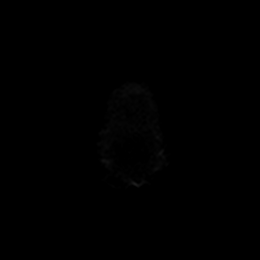

[Series 6: DWI · axial · 3.0mm · 0.88mm/px · z∈[-105,+42]mm · 3 of 49 slices shown (2 of 4)]
[im 1/49]
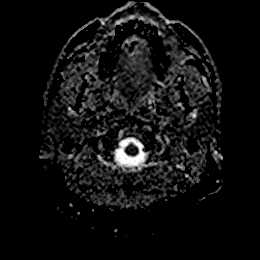
[im 25/49]
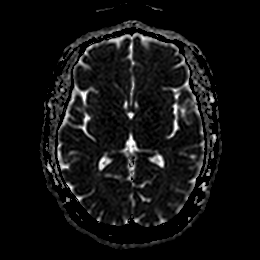
[im 49/49]
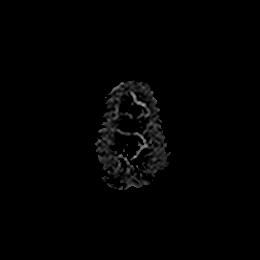

[Series 7: DWI · coronal · 4.0mm · 0.88mm/px · 5 of 64 slices shown (3 of 4)]
[im 1/64]
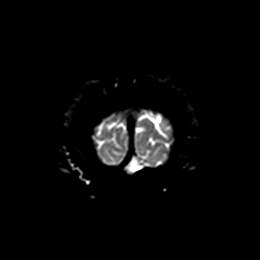
[im 16/64]
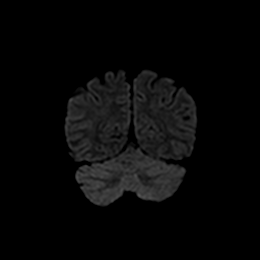
[im 32/64]
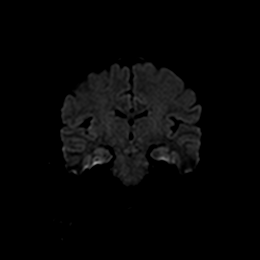
[im 48/64]
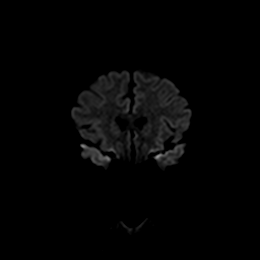
[im 64/64]
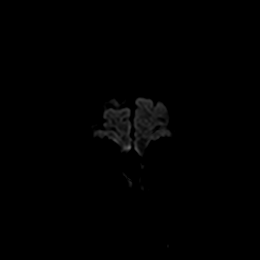

[Series 8: DWI · coronal · 4.0mm · 0.88mm/px · 3 of 32 slices shown (4 of 4)]
[im 1/32]
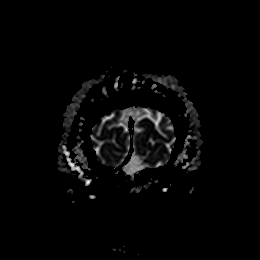
[im 16/32]
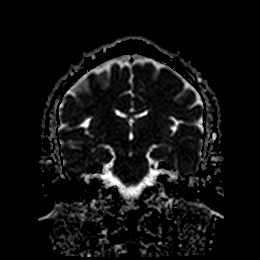
[im 32/32]
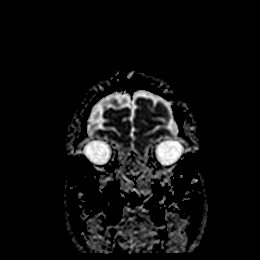

[Series 9: T1 · sagittal · 5.0mm · 0.78mm/px · 2 of 23 slices shown]
[im 1/23]
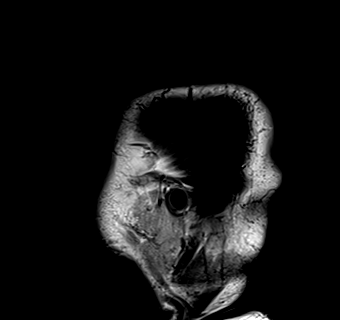
[im 23/23]
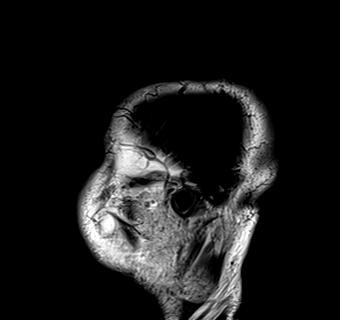

[Series 10: T2 · axial · 5.0mm · 0.72mm/px · z∈[-104,+40]mm · 2 of 25 slices shown]
[im 1/25]
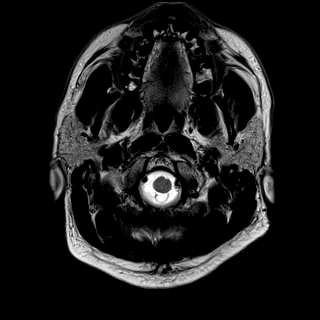
[im 25/25]
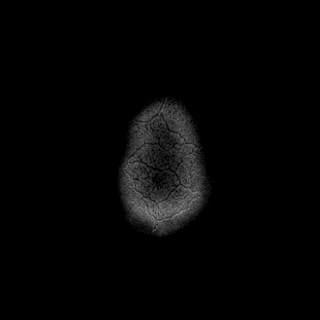

[Series 11: FLAIR · axial · 5.0mm · 0.45mm/px · z∈[-103,+41]mm · 2 of 25 slices shown]
[im 1/25]
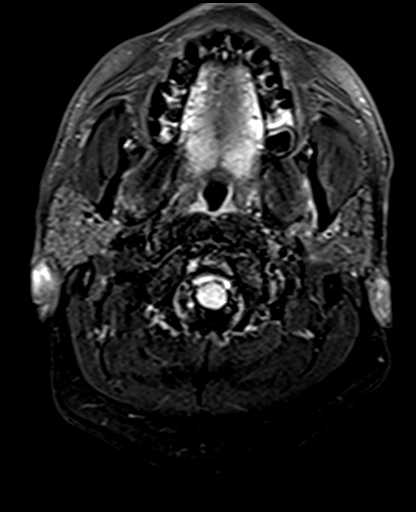
[im 25/25]
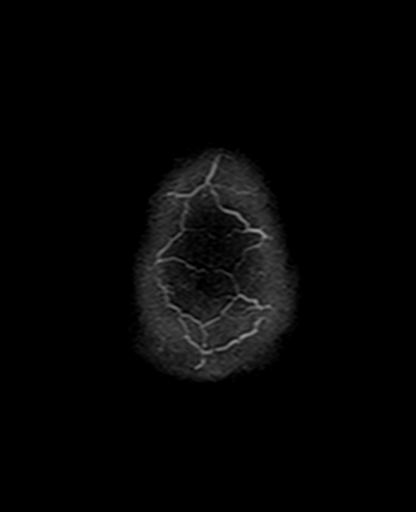

[Series 13: pha_images · axial · 3.0mm · 0.90mm/px · z∈[-120,+57]mm · 5 of 60 slices shown]
[im 1/60]
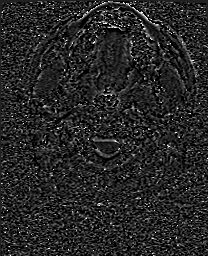
[im 15/60]
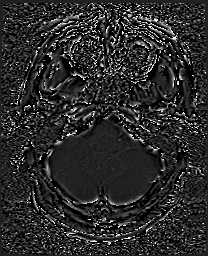
[im 30/60]
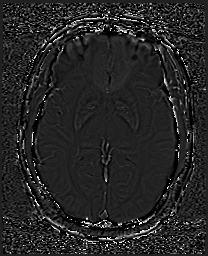
[im 45/60]
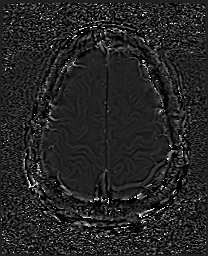
[im 60/60]
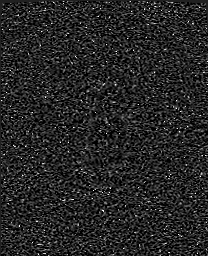

[Series 14: swi_images · axial · 3.0mm · 0.90mm/px · z∈[-120,+57]mm · 5 of 60 slices shown]
[im 1/60]
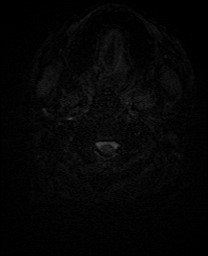
[im 15/60]
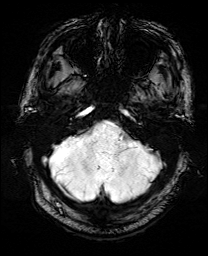
[im 30/60]
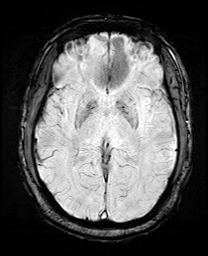
[im 45/60]
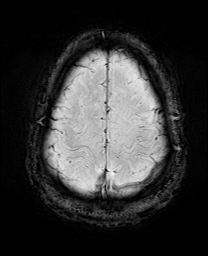
[im 60/60]
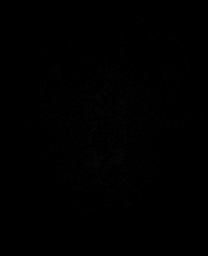

[Series 17: T2 post-contrast · coronal · 5.0mm · 0.72mm/px · 2 of 28 slices shown]
[im 1/28]
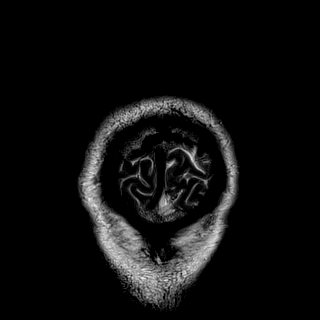
[im 28/28]
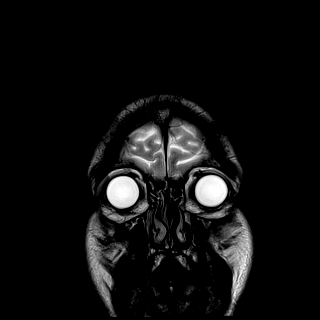

[Series 19: T1 post-contrast · coronal · 5.0mm · 0.34mm/px · 2 of 28 slices shown]
[im 1/28]
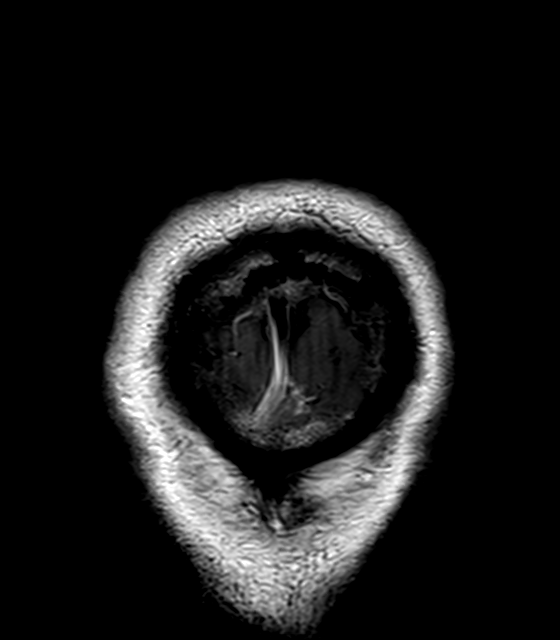
[im 28/28]
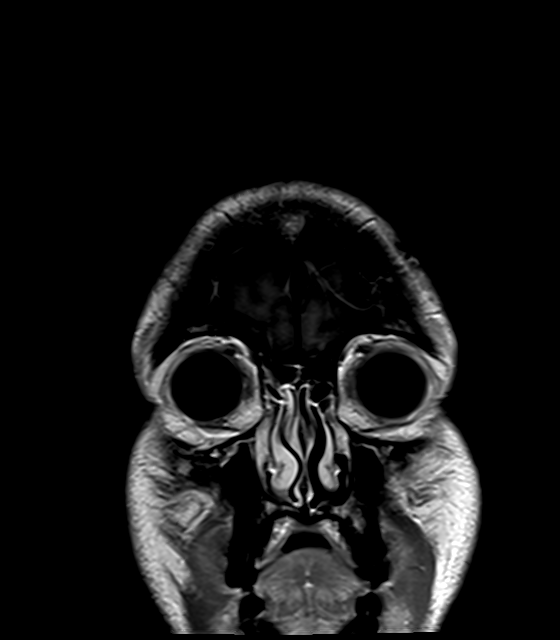

[38 of 48 positions shown; findings below may reference images not displayed]

FINDINGS: Brain: Intra-axial patchy T2 and FLAIR hyperintensity centered at
the left middle cerebellar peduncle (series 10, image 7) with
pronounced hemorrhage related susceptibility on SWI there (series
14, image 19). Blood products are mildly T1 hypointense (series 16
image 19. And this is at or near the left 5th cranial nerve root
entry zone (series 18, image 18), and the cisternal left 5th nerve
does appear mildly asymmetric, T2 hyperintense on series 17, image
14. Meckel's cave and cavernous sinuses appear to remain symmetric
and normal. No associated enhancement following contrast. No
definite abnormal leptomeningeal or dural thickening in the region.
No abnormal flow voids or vascularity in the region.

Associated susceptibility artifact on DWI, with no convincing No
restricted diffusion or evidence of acute infarction. No other
intracranial blood products identified. No intraventricular
hemorrhage or debris identified.

No midline shift, mass effect, evidence of mass lesion.
Cervicomedullary junction and pituitary are within normal limits.
Outside of the left cerebellar peduncle and brainstem gray and white
matter signal is within normal limits for age. No cortical
encephalomalacia identified.

No abnormal enhancement identified.

Vascular: Major intracranial vascular flow voids are preserved. The
major dural venous sinuses are enhancing and appear to be patent.

Skull and upper cervical spine: Partially visible cervical spine
degeneration and degenerative spinal stenosis at C3-C4 (series 9,
image 13). Background bone marrow signal within normal limits.

Sinuses/Orbits: Negative. Paranasal Visualized paranasal sinuses and
mastoids are stable and well aerated.

Other: Visible internal auditory structures appear normal. Negative
visible scalp and face.
IMPRESSION: 1. Confirmed small volume intra-axial hemorrhage centered at the
left middle cerebellar peduncle, and at or near the left 5th cranial
nerve root entry zone.
No associated enhancement, evidence of a larger infarct, or an
underlying lesion. No significant mass effect. But the cisternal
segment of the left trigeminal nerve does appear mildly edematous.

2. Elsewhere negative for age MRI appearance of the brain.

3. Partially visible cervical spine degeneration and degenerative
spinal stenosis at C3-C4.

## 2024-06-18 ENCOUNTER — Encounter (HOSPITAL_COMMUNITY): Payer: Self-pay | Admitting: *Deleted

## 2024-06-18 ENCOUNTER — Ambulatory Visit (HOSPITAL_COMMUNITY)
Admission: EM | Admit: 2024-06-18 | Discharge: 2024-06-18 | Disposition: A | Discharge status: Home / Self Care | Payer: Self-pay | Attending: Family Medicine | Admitting: Family Medicine

## 2024-06-18 ENCOUNTER — Emergency Department (HOSPITAL_COMMUNITY)
Admission: EM | Admit: 2024-06-18 | Discharge: 2024-06-18 | Disposition: A | Discharge status: Home / Self Care | Payer: Self-pay | Attending: Emergency Medicine | Admitting: Emergency Medicine

## 2024-06-18 ENCOUNTER — Emergency Department (HOSPITAL_COMMUNITY): Payer: Self-pay

## 2024-06-18 ENCOUNTER — Other Ambulatory Visit: Payer: Self-pay

## 2024-06-18 ENCOUNTER — Encounter (HOSPITAL_COMMUNITY): Payer: Self-pay

## 2024-06-18 DIAGNOSIS — R519 Headache, unspecified: Secondary | ICD-10-CM

## 2024-06-18 DIAGNOSIS — Z720 Tobacco use: Secondary | ICD-10-CM | POA: Insufficient documentation

## 2024-06-18 DIAGNOSIS — G4489 Other headache syndrome: Secondary | ICD-10-CM | POA: Insufficient documentation

## 2024-06-18 DIAGNOSIS — R42 Dizziness and giddiness: Secondary | ICD-10-CM

## 2024-06-18 DIAGNOSIS — Z8673 Personal history of transient ischemic attack (TIA), and cerebral infarction without residual deficits: Secondary | ICD-10-CM | POA: Insufficient documentation

## 2024-06-18 LAB — BASIC METABOLIC PANEL WITH GFR
Anion gap: 10 (ref 5–15)
BUN: 12 mg/dL (ref 6–20)
CO2: 26 mmol/L (ref 22–32)
Calcium: 9.5 mg/dL (ref 8.9–10.3)
Chloride: 105 mmol/L (ref 98–111)
Creatinine, Ser: 1.06 mg/dL (ref 0.61–1.24)
GFR, Estimated: >60 mL/min
Glucose, Bld: 113 mg/dL — ABNORMAL HIGH (ref 70–99)
Potassium: 4.2 mmol/L (ref 3.5–5.1)
Sodium: 141 mmol/L (ref 135–145)

## 2024-06-18 LAB — CBC
HCT: 47.5 % (ref 39.0–52.0)
Hemoglobin: 16 g/dL (ref 13.0–17.0)
MCH: 29.6 pg (ref 26.0–34.0)
MCHC: 33.7 g/dL (ref 30.0–36.0)
MCV: 88 fL (ref 80.0–100.0)
Platelets: 175 K/uL (ref 150–400)
RBC: 5.4 MIL/uL (ref 4.22–5.81)
RDW: 12.8 % (ref 11.5–15.5)
WBC: 6.5 K/uL (ref 4.0–10.5)
nRBC: 0 % (ref 0.0–0.2)

## 2024-06-18 MED ORDER — PROCHLORPERAZINE EDISYLATE 10 MG/2ML IJ SOLN
10.0000 mg | Freq: Once | INTRAMUSCULAR | Status: AC
  Administered 2024-06-18: 10 mg via INTRAVENOUS
  Filled 2024-06-18: qty 2

## 2024-06-18 MED ORDER — IOHEXOL 350 MG/ML SOLN
75.0000 mL | Freq: Once | INTRAVENOUS | Status: AC | PRN
  Administered 2024-06-18: 75 mL via INTRAVENOUS

## 2024-06-18 MED ORDER — LACTATED RINGERS IV BOLUS
1000.0000 mL | Freq: Once | INTRAVENOUS | Status: AC
  Administered 2024-06-18: 1000 mL via INTRAVENOUS

## 2024-06-18 MED ORDER — ACETAMINOPHEN 500 MG PO TABS
1000.0000 mg | ORAL_TABLET | Freq: Once | ORAL | Status: AC
  Administered 2024-06-18: 1000 mg via ORAL
  Filled 2024-06-18: qty 2

## 2024-06-18 NOTE — ED Notes (Signed)
 Patient is being discharged from the Urgent Care and sent to the Emergency Department via personal opperated vehicle . Per Dr Sharlet Linden, patient is in need of higher level of care due to headache and dizziness with history of stroke. Patient is aware and verbalizes understanding of plan of care.  Vitals:   06/18/24 1103  BP: 118/79  Pulse: 75  Resp: 17  Temp: 98.5 F (36.9 C)  SpO2: 98%

## 2024-06-18 NOTE — ED Provider Triage Note (Signed)
 Emergency Medicine Provider Triage Evaluation Note  Ian Baker , a 58 y.o. male  was evaluated in triage.  Pt complains of hypertension/headache/dizziness.  Patient notes spike in blood pressure at home yesterday which he attributes to eating poorly over the holidays.  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  Patient notes left-sided posterior pulsating sensation in his head, reports difficulty focusing with his vision, denies numbness/tingling/weakness of the extremities.  Review of Systems  Positive: As above Negative: As above  Physical Exam  BP 122/83   Pulse 69   Temp 97.9 F (36.6 C)   Resp 16   Ht 6' (1.829 m)   Wt 106.6 kg   SpO2 99%   BMI 31.87 kg/m  Gen:   Awake, no distress   Resp:  Normal effort  MSK:   Moves extremities without difficulty  Other:  No focal neurologic deficits on exam  Medical Decision Making  Medically screening exam initiated at 12:30 PM.  Appropriate orders placed.  Leopold Smyers was informed that the remainder of the evaluation will be completed by another provider, this initial triage assessment does not replace that evaluation, and the importance of remaining in the ED until their evaluation is complete.     Glendia Rocky SAILOR, [place] 06/18/24 1231

## 2024-06-18 NOTE — ED Provider Notes (Signed)
 " [facility]    CSN: [id number] Arrival date & time: 06/18/24  9089      History   Chief Complaint Chief Complaint  Patient presents with   Hypertension    HPI Ian Baker is a 58 y.o. male.    Hypertension  Here for headache and dizziness.  In 2023 had hemorrhagic stroke. Since then he has been checking his blood pressures off and on.  He notes that a lot of times with certain foods especially salty ones he will have some dull left-sided headache and will feel dizzy and off balance.  This comes and goes and usually will resolve within 24 hours.  He states that on December 25 he started having the same symptoms, with dizziness and left posterior headache and feeling off balance, after eating turkey.  He felt a little bit better this morning when he awoke, but then the headache and the dizziness have started bothering him again.  No fever or cough or congestion or dysuria or polydipsia or polyuria or polyphagia.  He notes to staff that his blood pressure was up when he checked it.  He does not really remember exact numbers but he states the systolic number was 130-140 and the bottom number was 78.  He does not take medication for hypertension  NKDA   Past Medical History:  Diagnosis Date   Arthritis    Chest pain    Normal coronary arteries 07/14/11 with normal LV function by echo at that time, negative for PE by CT angio 07/13/11   Chest pain    Hepatic steatosis    Hyperglycemia    A1C 5.8   Marijuana abuse    smokes about 1x/month   Overweight(278.02)    Thrombocytopenia    Noted 1/13 hospitalization - to f/u with PCP   Tobacco abuse    1 thin cigar every few months    Patient Active Problem List   Diagnosis Date Noted   ICH (intracerebral hemorrhage) (HCC) 10/18/2021   Prediabetes 02/08/2020   Hyperlipidemia with target LDL less than 100 02/08/2020   Benign prostatic hyperplasia with urinary frequency 02/07/2020   Class 1 obesity due to  excess calories without serious comorbidity with body mass index (BMI) of 34.0 to 34.9 in adult 02/07/2020   History of colon polyps 02/07/2020   Chest pain     Past Surgical History:  Procedure Laterality Date   APPENDECTOMY     20005   LEFT HEART CATHETERIZATION WITH CORONARY ANGIOGRAM N/A 07/14/2011   Procedure: LEFT HEART CATHETERIZATION WITH CORONARY ANGIOGRAM;  Surgeon: Debby JONETTA Como, MD;  Location: [facility] CATH LAB;  Service: Cardiovascular;  Laterality: N/A;       Home Medications    Prior to Admission medications  Medication Sig Start Date End Date Taking? Authorizing Provider  ibuprofen (ADVIL) 200 MG tablet Take 200 mg by mouth in the morning and at bedtime.   Yes [provider]  Cromolyn Sodium (NASAL ALLERGY NA) Place 1 spray into the nose daily as needed.    [provider]    Family History Family History  Problem Relation Age of Onset   Diabetes Mother    Coronary artery disease Mother        onset 93's, currently 3   Prostate cancer Maternal Uncle     Social History Social History[1]   Allergies   Patient has no known allergies.   Review of Systems Review of Systems   Physical Exam Triage Vital Signs  ED Triage Vitals  Encounter Vitals Group     BP 06/18/24 1103 118/79     Girls Systolic BP Percentile --      Girls Diastolic BP Percentile --      Boys Systolic BP Percentile --      Boys Diastolic BP Percentile --      Pulse Rate 06/18/24 1103 75     Resp 06/18/24 1103 17     Temp 06/18/24 1103 98.5 F (36.9 C)     Temp Source 06/18/24 1103 Oral     SpO2 06/18/24 1103 98 %     Weight --      Height 06/18/24 1102 6' (1.829 m)     Head Circumference --      Peak Flow --      Pain Score 06/18/24 1101 4     Pain Loc --      Pain Education --      Exclude from Growth Chart --    No data found.  Updated Vital Signs BP 118/79 (BP Location: Left Arm)   Pulse 75   Temp 98.5 F (36.9 C) (Oral)   Resp 17   Ht 6' (1.829  m)   SpO2 98%   BMI 31.19 kg/m   Visual Acuity Right Eye Distance:   Left Eye Distance:   Bilateral Distance:    Right Eye Near:   Left Eye Near:    Bilateral Near:     Physical Exam Vitals reviewed.  Constitutional:      General: He is not in acute distress.    Appearance: He is not ill-appearing, toxic-appearing or diaphoretic.  HENT:     Head: Normocephalic and atraumatic.     Left Ear: Tympanic membrane and ear canal normal.     Nose: Nose normal.     Mouth/Throat:     Mouth: Mucous membranes are moist.  Eyes:     Extraocular Movements: Extraocular movements intact.     Conjunctiva/sclera: Conjunctivae normal.     Pupils: Pupils are equal, round, and reactive to light.  Cardiovascular:     Rate and Rhythm: Normal rate and regular rhythm.     Heart sounds: No murmur heard. Pulmonary:     Effort: Pulmonary effort is normal.     Breath sounds: Normal breath sounds.  Musculoskeletal:     Cervical back: Neck supple.  Lymphadenopathy:     Cervical: No cervical adenopathy.  Skin:    Coloration: Skin is not pale.  Neurological:     General: No focal deficit present.     Mental Status: He is alert and oriented to person, place, and time.     Cranial Nerves: No cranial nerve deficit.  Psychiatric:        Behavior: Behavior normal.      UC Treatments / Results  Labs (all labs ordered are listed, but only abnormal results are displayed) Labs Reviewed - No data to display  EKG   Radiology No results found.  Procedures Procedures (including critical care time)  Medications Ordered in UC Medications - No data to display  Initial Impression / Assessment and Plan / UC Course  I have reviewed the triage vital signs and the nursing notes.  Pertinent labs & imaging results that were available during my care of the patient were reviewed by me and considered in my medical decision making (see chart for details).     We discussed considering doing labs here,  but with his dizziness and feeling  off balance and his history of a stroke, I think is best that he be evaluated in the emergency room on an urgent basis.  He is agreeable and will go there today for further evaluation. Final Clinical Impressions(s) / UC Diagnoses   Final diagnoses:  Dizziness  Left-sided headache     Discharge Instructions      He will go to the ER for further eval     ED Prescriptions   None    PDMP not reviewed this encounter.    [1]  Social History Tobacco Use   Smoking status: Never   Smokeless tobacco: Never   Tobacco comments:    smokes 1 cigar every month  Vaping Use   Vaping status: Never Used  Substance Use Topics   Alcohol use: Yes    Alcohol/week: 2.0 standard drinks of alcohol    Types: 2 Cans of beer per week    Comment: has a few drinks/month   Drug use: Not Currently    Types: Marijuana    Comment: uses marijuana about 1x/month     Vonna Sharlet POUR, MD 06/18/24 1137  "

## 2024-06-18 NOTE — ED Provider Notes (Signed)
 " [facility] Provider Note   HPI/ROS    History obtained from patient.  Ian Baker is a 58 y.o. male who presents for Headache and Dizziness and who  has a past medical history of Arthritis, Chest pain, Chest pain, Hepatic steatosis, Hyperglycemia, Marijuana abuse, Overweight(278.02), Thrombocytopenia, and Tobacco abuse.  Patient states that on Christmas Day he had a very salty meal.  Also states that he went to [employer] the next day and had a very salty meal.  States that in the past when he has had salty meals it is caused headaches in the same region where he had his hemorrhagic stroke in 2023.  He said the salty diet usually increases his blood pressure to the high 140s at home which he thinks may be the cause of the symptoms.  Denies taking any medications for blood pressure.  He states that usually pulsating, but dissipate after improving his diet and increasing his p.o. fluid intake.  He states that despite doing this over the last several days ago, he still has had continued symptoms.  Endorses a pulsating left-sided headache with associated difficulty ambulating.  Currently endorsing that same headache right now, but denying any visual changes, sensory deficits, or weakness.  States he still feels a little bit off balance currently, but states he was able to ambulate to his bathroom at home without difficulty.  Denies any fevers, chills, chest pain, shortness breath, nausea, vomiting, diarrhea.  MDM   I have reviewed the nursing documentation, vital signs, as well as the past medical history, surgical history, family history, and social history.  Initial Assessment:  Patient hemodynamically stable on initial evaluation.  Has completely nonfocal neurologic exam with normal gait, no signs of dysdiadochokinesia, motor deficits, or sensory deficits.  Does have a history of hemorrhagic stroke with no obvious hemorrhage noted on Noncon CT today.   Possible aneurysm concern from Noncon CT, so we will obtain CT angio.  TraLife abnormality or AKI. CBC with no significant leukocytosis or anemia and BMP with no significant electrolyte or maladies or AKI.  Nausea vomiting some dysuria but really no neck rigidity or fevers at home concerning for meningitis.  No altered mental status.  No ventriculomegaly on Noncon CT consistent with IIH.  Could be complex migraine, but patient has no history of these.  Could be a reversible cerebral vasoconstriction.  CTA head and neck unremarkable.  No obvious etiology of this headache aside from that is related to his blood pressure.  Discussed with the patient that given he is normally normotensive we will not be sending him home with a blood pressure medication.  He is okay with this and plans follow-up with his PCP in the next week.  Was told to take his blood pressure every day at home to try to get a more accurate average for his physician.  Also will send neurology referral given he has had these similar presentations in the past.  Could be a episode of complex migraines, but still unclear at this time.  Patient ambulatory in the ED without any difficulty would like to go home if is able to.  Given no obvious etiology of this headache/neurologic deficit and is completely resolved and he is not ambulating without difficulty feel stable for discharge.  Headache is resolved.  Never had any focal findings on neurologic exam and imaging here was reassuring.  Patient discharged home in hemodynamically stable condition with strict return precautions.  He plans to  follow-up with PCP and neurology in the outpatient setting.   Disposition:  I discussed the plan for discharge with the patient and/or their surrogate at bedside prior to discharge and they were in agreement with the plan and verbalized understanding of the return precautions provided. All questions answered to the best of my ability. Ultimately, the patient was  discharged in stable condition with stable vital signs. I am reassured that they are capable of close follow up and good social support at home.   This patient was staffed with Dr. Patt who supervised the visit and agreed with the plan of care.   Due to the patients current presenting symptoms, physical exam findings, and the workup stated above, it is thought that the etiology of the patients current presentation is:  1. Other headache syndrome    Clinical Complexity A medically appropriate history, review of systems, and physical exam was performed.  Factors that affect the complexity of this encounter: assessment of correct protocol, laboratory work from this visit, and review of echocardiogram/EKG results  My independent interpretations of diagnostic studies are documented in the ED course above.   If decision rules were used in this patient's evaluation, they are listed below.   Click here for ABCD2, HEART and other calculators  Patient's presentation is most consistent with acute complicated illness / injury requiring diagnostic workup.  MDM generated using voice dictation software and may contain dictation errors. Please contact me for any clarification or with any questions.    Physical Exam, PMH, PSH, Family History, and Social Hsitory   Vitals:   06/18/24 1151 06/18/24 1218 06/18/24 1500 06/18/24 1716  BP: 122/83  103/73 (!) 122/102  Pulse: 69  60 64  Resp: 16  17 14   Temp: 97.9 F (36.6 C)   98.1 F (36.7 C)  TempSrc:    Oral  SpO2: 99%  100% 100%  Weight:  106.6 kg    Height:  6' (1.829 m)      Physical Exam Constitutional:      Appearance: He is well-developed.  HENT:     Head: Normocephalic and atraumatic.     Mouth/Throat:     Mouth: Mucous membranes are moist.     Pharynx: Oropharynx is clear.  Eyes:     General: No visual field deficit.    Extraocular Movements: Extraocular movements intact.     Pupils: Pupils are equal, round, and reactive to light.   Cardiovascular:     Rate and Rhythm: Normal rate and regular rhythm.  Pulmonary:     Effort: Pulmonary effort is normal.     Breath sounds: Normal breath sounds. No wheezing or rales.  Abdominal:     General: There is no distension.     Palpations: Abdomen is soft.     Tenderness: There is no abdominal tenderness. There is no guarding.  Musculoskeletal:     Cervical back: Normal range of motion. No rigidity.  Skin:    General: Skin is warm and dry.     Capillary Refill: Capillary refill takes less than 2 seconds.  Neurological:     Mental Status: He is alert.     GCS: GCS eye subscore is 4. GCS verbal subscore is 5. GCS motor subscore is 6.     Cranial Nerves: Cranial nerves 2-12 are intact. No cranial nerve deficit, dysarthria or facial asymmetry.     Sensory: Sensation is intact.     Motor: Motor function is intact.     Coordination: Romberg  sign negative. Finger-Nose-Finger Test and Heel to [place] Test normal.     Gait: Gait is intact.     Comments: No dysdiadochokinesia.     Past Medical History:  Diagnosis Date   Arthritis    Chest pain    Normal coronary arteries 07/14/11 with normal LV function by echo at that time, negative for PE by CT angio 07/13/11   Chest pain    Hepatic steatosis    Hyperglycemia    A1C 5.8   Marijuana abuse    smokes about 1x/month   Overweight(278.02)    Thrombocytopenia    Noted 1/13 hospitalization - to f/u with PCP   Tobacco abuse    1 thin cigar every few months     Past Surgical History:  Procedure Laterality Date   APPENDECTOMY     20005   LEFT HEART CATHETERIZATION WITH CORONARY ANGIOGRAM N/A 07/14/2011   Procedure: LEFT HEART CATHETERIZATION WITH CORONARY ANGIOGRAM;  Surgeon: Debby JONETTA Como, MD;  Location: [facility] CATH LAB;  Service: Cardiovascular;  Laterality: N/A;     Family History  Problem Relation Age of Onset   Diabetes Mother    Coronary artery disease Mother        onset 46's, currently 60   Prostate cancer Maternal  Uncle     Social History   Tobacco Use   Smoking status: Never   Smokeless tobacco: Never   Tobacco comments:    smokes 1 cigar every month  Substance Use Topics   Alcohol use: Yes    Alcohol/week: 2.0 standard drinks of alcohol    Types: 2 Cans of beer per week    Comment: has a few drinks/month     Procedures   If procedures were preformed on this patient, they are listed below:  Procedures   Electronically signed by:   Glendia Carlin Ancona, M.D. PGY-2, Emergency Medicine   Please note that this documentation was produced with the assistance of voice-to-text technology and may contain errors.    Ancona Glendia, MD 06/18/24 2337  "

## 2024-06-18 NOTE — Discharge Instructions (Addendum)
 You were seen today for headache and difficulty ambulating. While you were here we monitored your vitals, preformed a physical exam, and labs and imaging. These were all reassuring and there is no indication for any further testing or intervention in the emergency department at this time.   Things to do:  - Follow up with your primary care provider within the next 1-2 weeks - Please schedule an appointment to follow-up with neurology in the outpatient setting, we have sent a referral for you - Please continue to monitor your blood pressure at home and follow-up with your PCP for potential blood pressure medications.  We are not writing them for you today as you are usually normotensive  Return to the emergency department if you have any new or worsening symptoms including worsening headaches, difficulty ambulating, numbness or weakness in your extremities, visual difficulties, or if you have any other concerns.

## 2024-06-18 NOTE — ED Triage Notes (Signed)
 Pt c.o headache, dizziness and some balance issues for the past 2 days. Pulsing pain behind his left ear. Pt [facility] from [facility] due to hx of ICH in 2024.  Pt states his Bp was high at home the past 2 days (140/80ish)

## 2024-06-18 NOTE — Discharge Instructions (Signed)
 He will go to the ER for further eval

## 2024-06-18 NOTE — ED Triage Notes (Signed)
 Patient presenting with elevated BP for the last 2 days. States he is not on any medications for blood pressure. States the BP will go up when eating certain foods but after christmas it has not come down. States will get a low grade headache. States there is throbbing behind the left ear on and off for the last 2 days.   Patient has a history of a hemorrhagic stroke on the left side.
